# Patient Record
Sex: Female | Born: 1950 | Race: White | Hispanic: No | Marital: Married | Smoking: Never smoker
Health system: Southern US, Community
[De-identification: ages and names within clinical notes are randomized; demographics above are authoritative.]

## PROBLEM LIST (undated history)

## (undated) DIAGNOSIS — A6000 Herpesviral infection of urogenital system, unspecified: Secondary | ICD-10-CM

## (undated) DIAGNOSIS — L409 Psoriasis, unspecified: Secondary | ICD-10-CM

## (undated) DIAGNOSIS — M899 Disorder of bone, unspecified: Secondary | ICD-10-CM

## (undated) DIAGNOSIS — G4700 Insomnia, unspecified: Secondary | ICD-10-CM

## (undated) DIAGNOSIS — E7800 Pure hypercholesterolemia, unspecified: Secondary | ICD-10-CM

## (undated) DIAGNOSIS — N952 Postmenopausal atrophic vaginitis: Secondary | ICD-10-CM

## (undated) DIAGNOSIS — M949 Disorder of cartilage, unspecified: Secondary | ICD-10-CM

## (undated) DIAGNOSIS — I341 Nonrheumatic mitral (valve) prolapse: Secondary | ICD-10-CM

## (undated) HISTORY — DX: Herpesviral infection of urogenital system, unspecified: A60.00

## (undated) HISTORY — DX: Disorder of cartilage, unspecified: M94.9

## (undated) HISTORY — DX: Postmenopausal atrophic vaginitis: N95.2

## (undated) HISTORY — DX: Insomnia, unspecified: G47.00

## (undated) HISTORY — DX: Psoriasis, unspecified: L40.9

## (undated) HISTORY — DX: Pure hypercholesterolemia, unspecified: E78.00

## (undated) HISTORY — DX: Nonrheumatic mitral (valve) prolapse: I34.1

## (undated) HISTORY — DX: Disorder of bone, unspecified: M89.9

---
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
## 1986-05-18 HISTORY — PX: SAPHENOUS VEIN GRAFT RESECTION: SHX2374

## 1997-12-27 ENCOUNTER — Other Ambulatory Visit: Admission: RE | Admit: 1997-12-27 | Discharge: 1997-12-27 | Payer: Self-pay | Admitting: Family Medicine

## 1998-01-03 ENCOUNTER — Ambulatory Visit (HOSPITAL_COMMUNITY): Admission: RE | Admit: 1998-01-03 | Discharge: 1998-01-03 | Payer: Self-pay | Admitting: Family Medicine

## 1999-02-07 ENCOUNTER — Ambulatory Visit (HOSPITAL_COMMUNITY): Admission: RE | Admit: 1999-02-07 | Discharge: 1999-02-07 | Payer: Self-pay | Admitting: Family Medicine

## 1999-02-07 ENCOUNTER — Encounter: Payer: Self-pay | Admitting: Family Medicine

## 1999-03-07 ENCOUNTER — Other Ambulatory Visit: Admission: RE | Admit: 1999-03-07 | Discharge: 1999-03-07 | Payer: Self-pay | Admitting: Podiatry

## 2000-05-18 HISTORY — PX: EXCISION NEUROMA: SHX6350

## 2002-09-04 ENCOUNTER — Ambulatory Visit (HOSPITAL_COMMUNITY): Admission: RE | Admit: 2002-09-04 | Discharge: 2002-09-04 | Payer: Self-pay | Admitting: Family Medicine

## 2002-09-04 ENCOUNTER — Encounter: Payer: Self-pay | Admitting: Family Medicine

## 2004-12-29 ENCOUNTER — Other Ambulatory Visit: Admission: RE | Admit: 2004-12-29 | Discharge: 2004-12-29 | Payer: Self-pay | Admitting: Family Medicine

## 2005-01-23 ENCOUNTER — Encounter: Admission: RE | Admit: 2005-01-23 | Discharge: 2005-01-23 | Payer: Self-pay | Admitting: Family Medicine

## 2006-02-18 ENCOUNTER — Encounter: Admission: RE | Admit: 2006-02-18 | Discharge: 2006-02-18 | Payer: Self-pay | Admitting: Family Medicine

## 2006-12-28 ENCOUNTER — Other Ambulatory Visit: Admission: RE | Admit: 2006-12-28 | Discharge: 2006-12-28 | Payer: Self-pay | Admitting: Family Medicine

## 2007-05-25 ENCOUNTER — Encounter: Admission: RE | Admit: 2007-05-25 | Discharge: 2007-05-25 | Payer: Self-pay | Admitting: Family Medicine

## 2008-01-06 ENCOUNTER — Other Ambulatory Visit: Admission: RE | Admit: 2008-01-06 | Discharge: 2008-01-06 | Payer: Self-pay | Admitting: Family Medicine

## 2008-02-10 ENCOUNTER — Encounter: Admission: RE | Admit: 2008-02-10 | Discharge: 2008-02-10 | Payer: Self-pay | Admitting: Family Medicine

## 2009-03-07 ENCOUNTER — Other Ambulatory Visit: Admission: RE | Admit: 2009-03-07 | Discharge: 2009-03-07 | Payer: Self-pay | Admitting: Family Medicine

## 2009-03-15 ENCOUNTER — Ambulatory Visit (HOSPITAL_COMMUNITY): Admission: RE | Admit: 2009-03-15 | Discharge: 2009-03-15 | Payer: Self-pay | Admitting: Family Medicine

## 2010-03-10 ENCOUNTER — Other Ambulatory Visit: Admission: RE | Admit: 2010-03-10 | Discharge: 2010-03-10 | Payer: Self-pay | Admitting: Family Medicine

## 2010-04-01 ENCOUNTER — Encounter: Admission: RE | Admit: 2010-04-01 | Discharge: 2010-04-01 | Payer: Self-pay | Admitting: Family Medicine

## 2010-06-07 ENCOUNTER — Encounter: Payer: Self-pay | Admitting: Family Medicine

## 2010-06-09 ENCOUNTER — Encounter: Payer: Self-pay | Admitting: Family Medicine

## 2013-07-18 ENCOUNTER — Encounter: Payer: Self-pay | Admitting: *Deleted

## 2013-07-19 ENCOUNTER — Telehealth: Payer: Self-pay

## 2013-07-19 ENCOUNTER — Encounter (INDEPENDENT_AMBULATORY_CARE_PROVIDER_SITE_OTHER): Payer: Self-pay

## 2013-07-19 ENCOUNTER — Ambulatory Visit (INDEPENDENT_AMBULATORY_CARE_PROVIDER_SITE_OTHER): Payer: BC Managed Care – PPO | Admitting: Cardiology

## 2013-07-19 ENCOUNTER — Encounter: Payer: Self-pay | Admitting: Cardiology

## 2013-07-19 VITALS — BP 128/70 | HR 63 | Ht 67.0 in | Wt 145.0 lb

## 2013-07-19 DIAGNOSIS — R55 Syncope and collapse: Secondary | ICD-10-CM

## 2013-07-19 DIAGNOSIS — R0609 Other forms of dyspnea: Secondary | ICD-10-CM

## 2013-07-19 DIAGNOSIS — I341 Nonrheumatic mitral (valve) prolapse: Secondary | ICD-10-CM

## 2013-07-19 DIAGNOSIS — I059 Rheumatic mitral valve disease, unspecified: Secondary | ICD-10-CM

## 2013-07-19 DIAGNOSIS — R0989 Other specified symptoms and signs involving the circulatory and respiratory systems: Secondary | ICD-10-CM

## 2013-07-19 NOTE — Patient Instructions (Signed)
**Note De-Identified Kaila Devries Obfuscation** Your physician has requested that you have an exercise tolerance test. For further information please visit [email or web address]. Please also follow instruction sheet, as given.  Your physician has requested that you have an echocardiogram. Echocardiography is a painless test that uses sound waves to create images of your heart. It provides your doctor with information about the size and shape of your heart and how well your heart's chambers and valves are working. This procedure takes approximately one hour. There are no restrictions for this procedure.  Your physician recommends that you schedule a follow-up appointment in: after tests

## 2013-07-19 NOTE — Progress Notes (Signed)
Patient ID: Jaclyn [name], female   DOB: 27-Feb-1951, 63 y.o.   MRN: [id number]    Patient Name: Jaclyn [name] Date of Encounter: 07/19/2013  Primary Care Provider:  Marjorie Smolder, MD Primary Cardiologist:  Dorothy Spark  Problem List   Past Medical History  Diagnosis Date  . Recurrent genital herpes   . MVP (mitral valve prolapse)     SBE prophylaxis  . Psoriasis   . Disorder of bone and cartilage, unspecified   . Insomnia, unspecified   . Pure hypercholesterolemia   . Postmenopausal atrophic vaginitis    Past Surgical History  Procedure Laterality Date  . Saphenous vein graft resection Left 1988  . Excision neuroma Right 2002    foot  . Tonsillectomy  1959    Allergies  No Known Allergies  HPI  A very pleasant 63 year old female with prior medical history of mitral valve prolapse and a very significant family history of coronary artery disease who is coming for concern of episode of weakness that happened last week. The patient is generally in very good health and walks almost daily for an hour with her dog. The patient had a power bar prior to walking and after 30 minutes of walking she developed acute weakness, fatigue. She denies any chest pain but mild shortness of breath during the episode. She continued to walk at t a slow pace and at the end of the night her symptoms resolved. She never had an episode like that before. Besides walking she also exercises on treadmill. She sometimes experiences exertional shortness of breath but its infrequent. She denies any chest pain. She describes episodes of palpitations especially when she is emotionally stressed but were never associated with syncopal episode. She has history of hypercholesterolemia currently not treated. Her labs were tested by her primary care physician recently and she was told they were all within normal limit. She is only taking vitamin D supplements. Her family history significant for heart attack in her mom  at age of 7, that eventually died of congestive heart failure. Her father died of congestive heart failure. Her brother had 2 stents placed at age of 72. Her younger sister had a stroke in her 27s.  Home Medications  Prior to Admission medications   Medication Sig Start Date End Date Taking? Authorizing Provider  cholecalciferol (VITAMIN D) 400 UNITS TABS tablet Take 400 Units by mouth.   Yes Historical Provider, MD    Family History  Family History  Problem Relation Age of Onset  . Congestive Heart Failure Father   . Other Father     ruptured mitral valve  . Coronary artery disease Mother   . Hypertension Mother   . Osteoporosis Mother   . Heart attack Mother 42  . Hypertension Brother   . Hypercholesterolemia Brother   . CAD Brother     2 stents  . Stroke Sister 13  . Hypertension Sister     Social History  History   Social History  . Marital Status: Married    Spouse Name: N/A    Number of Children: 2  . Years of Education: N/A   Occupational History  . Not on file.   Social History Main Topics  . Smoking status: Never Smoker   . Smokeless tobacco: Never Used  . Alcohol Use: Yes     Comment: occasionally wine  . Drug Use: No  . Sexual Activity: Yes    Birth Control/ Protection: Other-see comments     Comment: menopause  Other Topics Concern  . Not on file   Social History Narrative  . No narrative on file     Review of Systems, as per HPI, otherwise negative General:  No chills, fever, night sweats or weight changes.  Cardiovascular:  No chest pain, dyspnea on exertion, edema, orthopnea, palpitations, paroxysmal nocturnal dyspnea. Dermatological: No rash, lesions/masses Respiratory: No cough, dyspnea Urologic: No hematuria, dysuria Abdominal:   No nausea, vomiting, diarrhea, bright red blood per rectum, melena, or hematemesis Neurologic:  No visual changes, wkns, changes in mental status. All other systems reviewed and are otherwise negative  except as noted above.  Physical Exam  Blood pressure 128/70, pulse 63, height 5\' 7"  (1.702 m), weight 145 lb (65.772 kg).  General: Pleasant, NAD Psych: Normal affect. Neuro: Alert and oriented X 3. Moves all extremities spontaneously. HEENT: Normal  Neck: Supple without bruits or JVD. Lungs:  Resp regular and unlabored, CTA. Heart: RRR no s3, s4, or murmurs. Abdomen: Soft, non-tender, non-distended, BS + x 4.  Extremities: No clubbing, cyanosis or edema. DP/PT/Radials 2+ and equal bilaterally.  Labs: none  Accessory Clinical Findings  echocardiogram  ECG - NSR, rightward axis, otherwise normal ECG.   Assessment & Plan  63 year old female  1. episode of weakness and shortness of breath - considering her significant family history of coronary artery disease we will order an exercise treadmill stress test to evaluate for ischemia. These might have been related to hypoglycemia after insulin surge after eating a power bar.  2. Hypercholesterolemia - we will request her labs from her primary care physician  3. History of mitral wall prolapse and palpitations - we will order a transthoracic echocardiogram to evaluate for mitral valve abnormalities, left atrial size.  Followup in one month  Dorothy Spark, MD, [facility] 07/19/2013, 11:30 AM

## 2013-07-19 NOTE — Telephone Encounter (Signed)
**Note De-Identified Jaclyn [name] Obfuscation** Pt was seen in [place] today with Dr Meda Coffee. Per Dr Meda Coffee I called Dr Moses Manners office to request a copy of the pts recent lab work (Lipids, CMP and CBC). I was advised that that office is having computer and telephone issues today that is being worked on and that as soon as their system is working they will fax over those lab results to ATTN: Jeani Hawking. Also, I gave our office phone number and fax number so info can be sent.

## 2013-07-28 ENCOUNTER — Telehealth (HOSPITAL_COMMUNITY): Payer: Self-pay

## 2013-08-02 ENCOUNTER — Encounter (HOSPITAL_COMMUNITY): Payer: BC Managed Care – PPO

## 2013-08-02 ENCOUNTER — Inpatient Hospital Stay (HOSPITAL_COMMUNITY)
Admission: RE | Admit: 2013-08-02 | Discharge: 2013-08-02 | Disposition: A | Discharge status: Home / Self Care | Payer: BC Managed Care – PPO | Source: Ambulatory Visit | Attending: Cardiology | Admitting: Cardiology

## 2013-08-02 DIAGNOSIS — I341 Nonrheumatic mitral (valve) prolapse: Secondary | ICD-10-CM

## 2013-08-11 ENCOUNTER — Encounter: Payer: Self-pay | Admitting: Cardiology

## 2013-08-11 ENCOUNTER — Telehealth (HOSPITAL_COMMUNITY): Payer: Self-pay

## 2013-08-16 ENCOUNTER — Ambulatory Visit (HOSPITAL_COMMUNITY): Payer: BC Managed Care – PPO

## 2013-08-16 ENCOUNTER — Encounter (HOSPITAL_COMMUNITY): Payer: BC Managed Care – PPO

## 2013-08-21 ENCOUNTER — Ambulatory Visit: Payer: BC Managed Care – PPO | Admitting: Cardiology

## 2013-11-23 NOTE — Telephone Encounter (Signed)
Encounter complete. 

## 2014-07-18 ENCOUNTER — Other Ambulatory Visit: Payer: Self-pay | Admitting: Family Medicine

## 2014-07-18 ENCOUNTER — Other Ambulatory Visit (HOSPITAL_COMMUNITY)
Admission: RE | Admit: 2014-07-18 | Discharge: 2014-07-18 | Disposition: A | Discharge status: Home / Self Care | Payer: BC Managed Care – PPO | Source: Ambulatory Visit | Attending: Family Medicine | Admitting: Family Medicine

## 2014-07-18 DIAGNOSIS — R87810 Cervical high risk human papillomavirus (HPV) DNA test positive: Secondary | ICD-10-CM | POA: Diagnosis present

## 2014-07-18 DIAGNOSIS — Z124 Encounter for screening for malignant neoplasm of cervix: Secondary | ICD-10-CM | POA: Diagnosis not present

## 2014-07-18 DIAGNOSIS — Z1151 Encounter for screening for human papillomavirus (HPV): Secondary | ICD-10-CM | POA: Diagnosis present

## 2014-07-19 LAB — CYTOLOGY - PAP

## 2015-01-10 DIAGNOSIS — H43812 Vitreous degeneration, left eye: Secondary | ICD-10-CM | POA: Insufficient documentation

## 2015-01-30 ENCOUNTER — Other Ambulatory Visit: Payer: Self-pay | Admitting: Family Medicine

## 2015-01-30 ENCOUNTER — Other Ambulatory Visit (HOSPITAL_COMMUNITY)
Admission: RE | Admit: 2015-01-30 | Discharge: 2015-01-30 | Disposition: A | Discharge status: Home / Self Care | Payer: BC Managed Care – PPO | Source: Ambulatory Visit | Attending: Family Medicine | Admitting: Family Medicine

## 2015-01-30 DIAGNOSIS — Z1151 Encounter for screening for human papillomavirus (HPV): Secondary | ICD-10-CM | POA: Insufficient documentation

## 2015-01-30 DIAGNOSIS — Z01411 Encounter for gynecological examination (general) (routine) with abnormal findings: Secondary | ICD-10-CM | POA: Insufficient documentation

## 2015-02-04 LAB — CYTOLOGY - PAP

## 2015-02-15 ENCOUNTER — Other Ambulatory Visit: Payer: Self-pay | Admitting: Obstetrics & Gynecology

## 2015-03-25 ENCOUNTER — Ambulatory Visit (HOSPITAL_COMMUNITY)
Admission: RE | Admit: 2015-03-25 | Payer: BC Managed Care – PPO | Source: Ambulatory Visit | Admitting: Obstetrics & Gynecology

## 2015-03-25 ENCOUNTER — Encounter (HOSPITAL_COMMUNITY): Admission: RE | Payer: Self-pay | Source: Ambulatory Visit

## 2015-03-25 SURGERY — CONE BIOPSY, CERVIX
Anesthesia: Choice

## 2015-03-27 ENCOUNTER — Other Ambulatory Visit: Payer: Self-pay | Admitting: Obstetrics and Gynecology

## 2015-04-19 ENCOUNTER — Other Ambulatory Visit: Payer: Self-pay | Admitting: Obstetrics and Gynecology

## 2015-08-22 DIAGNOSIS — Z0000 Encounter for general adult medical examination without abnormal findings: Secondary | ICD-10-CM | POA: Diagnosis not present

## 2015-08-22 DIAGNOSIS — E7800 Pure hypercholesterolemia, unspecified: Secondary | ICD-10-CM | POA: Diagnosis not present

## 2015-08-22 DIAGNOSIS — D069 Carcinoma in situ of cervix, unspecified: Secondary | ICD-10-CM | POA: Diagnosis not present

## 2015-08-22 DIAGNOSIS — Z23 Encounter for immunization: Secondary | ICD-10-CM | POA: Diagnosis not present

## 2015-08-22 DIAGNOSIS — Z131 Encounter for screening for diabetes mellitus: Secondary | ICD-10-CM | POA: Diagnosis not present

## 2015-08-26 ENCOUNTER — Other Ambulatory Visit: Payer: Self-pay | Admitting: Family Medicine

## 2015-08-26 DIAGNOSIS — M8580 Other specified disorders of bone density and structure, unspecified site: Secondary | ICD-10-CM

## 2015-09-11 ENCOUNTER — Other Ambulatory Visit: Payer: Self-pay | Admitting: Family Medicine

## 2015-09-11 DIAGNOSIS — E2839 Other primary ovarian failure: Secondary | ICD-10-CM

## 2015-09-12 ENCOUNTER — Ambulatory Visit
Admission: RE | Admit: 2015-09-12 | Discharge: 2015-09-12 | Disposition: A | Discharge status: Home / Self Care | Payer: PPO | Source: Ambulatory Visit | Attending: Family Medicine | Admitting: Family Medicine

## 2015-09-12 DIAGNOSIS — M85852 Other specified disorders of bone density and structure, left thigh: Secondary | ICD-10-CM | POA: Diagnosis not present

## 2015-09-12 DIAGNOSIS — E2839 Other primary ovarian failure: Secondary | ICD-10-CM

## 2015-09-12 DIAGNOSIS — Z780 Asymptomatic menopausal state: Secondary | ICD-10-CM | POA: Diagnosis not present

## 2015-09-18 DIAGNOSIS — N3000 Acute cystitis without hematuria: Secondary | ICD-10-CM | POA: Diagnosis not present

## 2015-09-20 DIAGNOSIS — Z1231 Encounter for screening mammogram for malignant neoplasm of breast: Secondary | ICD-10-CM | POA: Diagnosis not present

## 2015-11-14 ENCOUNTER — Other Ambulatory Visit: Payer: Self-pay | Admitting: Obstetrics and Gynecology

## 2015-11-14 DIAGNOSIS — N871 Moderate cervical dysplasia: Secondary | ICD-10-CM | POA: Diagnosis not present

## 2015-11-18 LAB — CYTOLOGY - PAP

## 2015-12-18 DIAGNOSIS — H2513 Age-related nuclear cataract, bilateral: Secondary | ICD-10-CM | POA: Diagnosis not present

## 2016-08-20 DIAGNOSIS — R030 Elevated blood-pressure reading, without diagnosis of hypertension: Secondary | ICD-10-CM | POA: Diagnosis not present

## 2016-08-20 DIAGNOSIS — J069 Acute upper respiratory infection, unspecified: Secondary | ICD-10-CM | POA: Diagnosis not present

## 2016-09-03 DIAGNOSIS — M8580 Other specified disorders of bone density and structure, unspecified site: Secondary | ICD-10-CM | POA: Diagnosis not present

## 2016-09-03 DIAGNOSIS — E559 Vitamin D deficiency, unspecified: Secondary | ICD-10-CM | POA: Diagnosis not present

## 2016-09-03 DIAGNOSIS — E7800 Pure hypercholesterolemia, unspecified: Secondary | ICD-10-CM | POA: Diagnosis not present

## 2016-09-03 DIAGNOSIS — Z23 Encounter for immunization: Secondary | ICD-10-CM | POA: Diagnosis not present

## 2016-09-03 DIAGNOSIS — Z0000 Encounter for general adult medical examination without abnormal findings: Secondary | ICD-10-CM | POA: Diagnosis not present

## 2016-09-03 DIAGNOSIS — Z131 Encounter for screening for diabetes mellitus: Secondary | ICD-10-CM | POA: Diagnosis not present

## 2016-09-30 DIAGNOSIS — Z1211 Encounter for screening for malignant neoplasm of colon: Secondary | ICD-10-CM | POA: Diagnosis not present

## 2016-09-30 DIAGNOSIS — Z1212 Encounter for screening for malignant neoplasm of rectum: Secondary | ICD-10-CM | POA: Diagnosis not present

## 2016-10-16 DIAGNOSIS — R195 Other fecal abnormalities: Secondary | ICD-10-CM | POA: Diagnosis not present

## 2016-11-19 DIAGNOSIS — Z6822 Body mass index (BMI) 22.0-22.9, adult: Secondary | ICD-10-CM | POA: Diagnosis not present

## 2016-11-19 DIAGNOSIS — Z01419 Encounter for gynecological examination (general) (routine) without abnormal findings: Secondary | ICD-10-CM | POA: Diagnosis not present

## 2016-11-19 DIAGNOSIS — Z124 Encounter for screening for malignant neoplasm of cervix: Secondary | ICD-10-CM | POA: Diagnosis not present

## 2016-11-19 DIAGNOSIS — Z1231 Encounter for screening mammogram for malignant neoplasm of breast: Secondary | ICD-10-CM | POA: Diagnosis not present

## 2016-11-30 ENCOUNTER — Other Ambulatory Visit: Payer: Self-pay | Admitting: Gastroenterology

## 2016-11-30 DIAGNOSIS — R195 Other fecal abnormalities: Secondary | ICD-10-CM

## 2016-12-11 ENCOUNTER — Ambulatory Visit
Admission: RE | Admit: 2016-12-11 | Discharge: 2016-12-11 | Disposition: A | Discharge status: Home / Self Care | Payer: PPO | Source: Ambulatory Visit | Attending: Gastroenterology | Admitting: Gastroenterology

## 2016-12-11 DIAGNOSIS — Z1211 Encounter for screening for malignant neoplasm of colon: Secondary | ICD-10-CM | POA: Diagnosis not present

## 2016-12-11 DIAGNOSIS — R195 Other fecal abnormalities: Secondary | ICD-10-CM

## 2016-12-23 DIAGNOSIS — H2513 Age-related nuclear cataract, bilateral: Secondary | ICD-10-CM | POA: Diagnosis not present

## 2016-12-23 DIAGNOSIS — H524 Presbyopia: Secondary | ICD-10-CM | POA: Diagnosis not present

## 2016-12-23 DIAGNOSIS — H5203 Hypermetropia, bilateral: Secondary | ICD-10-CM | POA: Diagnosis not present

## 2016-12-23 DIAGNOSIS — H52203 Unspecified astigmatism, bilateral: Secondary | ICD-10-CM | POA: Diagnosis not present

## 2017-06-03 DIAGNOSIS — D229 Melanocytic nevi, unspecified: Secondary | ICD-10-CM | POA: Diagnosis not present

## 2017-06-03 DIAGNOSIS — L409 Psoriasis, unspecified: Secondary | ICD-10-CM | POA: Diagnosis not present

## 2017-06-03 DIAGNOSIS — I872 Venous insufficiency (chronic) (peripheral): Secondary | ICD-10-CM | POA: Diagnosis not present

## 2017-06-10 DIAGNOSIS — R05 Cough: Secondary | ICD-10-CM | POA: Diagnosis not present

## 2017-06-17 DIAGNOSIS — I83893 Varicose veins of bilateral lower extremities with other complications: Secondary | ICD-10-CM | POA: Diagnosis not present

## 2017-06-17 DIAGNOSIS — I8311 Varicose veins of right lower extremity with inflammation: Secondary | ICD-10-CM | POA: Diagnosis not present

## 2017-06-17 DIAGNOSIS — I8312 Varicose veins of left lower extremity with inflammation: Secondary | ICD-10-CM | POA: Diagnosis not present

## 2017-06-17 DIAGNOSIS — I83813 Varicose veins of bilateral lower extremities with pain: Secondary | ICD-10-CM | POA: Diagnosis not present

## 2017-06-20 DIAGNOSIS — J069 Acute upper respiratory infection, unspecified: Secondary | ICD-10-CM | POA: Diagnosis not present

## 2017-06-20 DIAGNOSIS — J029 Acute pharyngitis, unspecified: Secondary | ICD-10-CM | POA: Diagnosis not present

## 2017-06-20 DIAGNOSIS — R05 Cough: Secondary | ICD-10-CM | POA: Diagnosis not present

## 2017-07-07 DIAGNOSIS — I8312 Varicose veins of left lower extremity with inflammation: Secondary | ICD-10-CM | POA: Diagnosis not present

## 2017-07-07 DIAGNOSIS — I83813 Varicose veins of bilateral lower extremities with pain: Secondary | ICD-10-CM | POA: Diagnosis not present

## 2017-07-07 DIAGNOSIS — I83893 Varicose veins of bilateral lower extremities with other complications: Secondary | ICD-10-CM | POA: Diagnosis not present

## 2017-07-07 DIAGNOSIS — I8311 Varicose veins of right lower extremity with inflammation: Secondary | ICD-10-CM | POA: Diagnosis not present

## 2017-08-02 DIAGNOSIS — I83813 Varicose veins of bilateral lower extremities with pain: Secondary | ICD-10-CM | POA: Diagnosis not present

## 2017-08-02 DIAGNOSIS — I83893 Varicose veins of bilateral lower extremities with other complications: Secondary | ICD-10-CM | POA: Diagnosis not present

## 2017-10-14 DIAGNOSIS — E559 Vitamin D deficiency, unspecified: Secondary | ICD-10-CM | POA: Diagnosis not present

## 2017-10-14 DIAGNOSIS — E7800 Pure hypercholesterolemia, unspecified: Secondary | ICD-10-CM | POA: Diagnosis not present

## 2017-10-14 DIAGNOSIS — Z0000 Encounter for general adult medical examination without abnormal findings: Secondary | ICD-10-CM | POA: Diagnosis not present

## 2017-10-14 DIAGNOSIS — M8580 Other specified disorders of bone density and structure, unspecified site: Secondary | ICD-10-CM | POA: Diagnosis not present

## 2017-10-14 DIAGNOSIS — Z131 Encounter for screening for diabetes mellitus: Secondary | ICD-10-CM | POA: Diagnosis not present

## 2017-10-19 ENCOUNTER — Other Ambulatory Visit: Payer: Self-pay | Admitting: Family Medicine

## 2017-10-19 DIAGNOSIS — M8580 Other specified disorders of bone density and structure, unspecified site: Secondary | ICD-10-CM

## 2017-12-06 DIAGNOSIS — Z1231 Encounter for screening mammogram for malignant neoplasm of breast: Secondary | ICD-10-CM | POA: Diagnosis not present

## 2017-12-09 ENCOUNTER — Ambulatory Visit
Admission: RE | Admit: 2017-12-09 | Discharge: 2017-12-09 | Disposition: A | Discharge status: Home / Self Care | Payer: PPO | Source: Ambulatory Visit | Attending: Family Medicine | Admitting: Family Medicine

## 2017-12-09 ENCOUNTER — Encounter: Payer: Self-pay | Admitting: Radiology

## 2017-12-09 DIAGNOSIS — Z780 Asymptomatic menopausal state: Secondary | ICD-10-CM | POA: Diagnosis not present

## 2017-12-09 DIAGNOSIS — M85852 Other specified disorders of bone density and structure, left thigh: Secondary | ICD-10-CM | POA: Diagnosis not present

## 2017-12-09 DIAGNOSIS — M8580 Other specified disorders of bone density and structure, unspecified site: Secondary | ICD-10-CM

## 2017-12-29 DIAGNOSIS — H5203 Hypermetropia, bilateral: Secondary | ICD-10-CM | POA: Diagnosis not present

## 2017-12-29 DIAGNOSIS — H524 Presbyopia: Secondary | ICD-10-CM | POA: Diagnosis not present

## 2017-12-29 DIAGNOSIS — H52203 Unspecified astigmatism, bilateral: Secondary | ICD-10-CM | POA: Diagnosis not present

## 2017-12-29 DIAGNOSIS — H2513 Age-related nuclear cataract, bilateral: Secondary | ICD-10-CM | POA: Diagnosis not present

## 2018-02-21 DIAGNOSIS — M25511 Pain in right shoulder: Secondary | ICD-10-CM | POA: Diagnosis not present

## 2018-03-03 DIAGNOSIS — M9902 Segmental and somatic dysfunction of thoracic region: Secondary | ICD-10-CM | POA: Diagnosis not present

## 2018-03-03 DIAGNOSIS — M9903 Segmental and somatic dysfunction of lumbar region: Secondary | ICD-10-CM | POA: Diagnosis not present

## 2018-03-03 DIAGNOSIS — M9905 Segmental and somatic dysfunction of pelvic region: Secondary | ICD-10-CM | POA: Diagnosis not present

## 2018-03-03 DIAGNOSIS — M9904 Segmental and somatic dysfunction of sacral region: Secondary | ICD-10-CM | POA: Diagnosis not present

## 2018-03-04 DIAGNOSIS — Z23 Encounter for immunization: Secondary | ICD-10-CM | POA: Diagnosis not present

## 2018-03-10 DIAGNOSIS — M9904 Segmental and somatic dysfunction of sacral region: Secondary | ICD-10-CM | POA: Diagnosis not present

## 2018-03-10 DIAGNOSIS — M9902 Segmental and somatic dysfunction of thoracic region: Secondary | ICD-10-CM | POA: Diagnosis not present

## 2018-03-10 DIAGNOSIS — M9905 Segmental and somatic dysfunction of pelvic region: Secondary | ICD-10-CM | POA: Diagnosis not present

## 2018-03-10 DIAGNOSIS — M9903 Segmental and somatic dysfunction of lumbar region: Secondary | ICD-10-CM | POA: Diagnosis not present

## 2018-03-11 ENCOUNTER — Other Ambulatory Visit: Payer: Self-pay | Admitting: Physician Assistant

## 2018-03-11 DIAGNOSIS — D229 Melanocytic nevi, unspecified: Secondary | ICD-10-CM | POA: Diagnosis not present

## 2018-03-11 DIAGNOSIS — L720 Epidermal cyst: Secondary | ICD-10-CM | POA: Diagnosis not present

## 2018-03-17 DIAGNOSIS — M9903 Segmental and somatic dysfunction of lumbar region: Secondary | ICD-10-CM | POA: Diagnosis not present

## 2018-03-17 DIAGNOSIS — M9905 Segmental and somatic dysfunction of pelvic region: Secondary | ICD-10-CM | POA: Diagnosis not present

## 2018-03-17 DIAGNOSIS — M9902 Segmental and somatic dysfunction of thoracic region: Secondary | ICD-10-CM | POA: Diagnosis not present

## 2018-03-17 DIAGNOSIS — M9904 Segmental and somatic dysfunction of sacral region: Secondary | ICD-10-CM | POA: Diagnosis not present

## 2018-03-31 DIAGNOSIS — M9904 Segmental and somatic dysfunction of sacral region: Secondary | ICD-10-CM | POA: Diagnosis not present

## 2018-03-31 DIAGNOSIS — M9903 Segmental and somatic dysfunction of lumbar region: Secondary | ICD-10-CM | POA: Diagnosis not present

## 2018-03-31 DIAGNOSIS — M9902 Segmental and somatic dysfunction of thoracic region: Secondary | ICD-10-CM | POA: Diagnosis not present

## 2018-03-31 DIAGNOSIS — M9905 Segmental and somatic dysfunction of pelvic region: Secondary | ICD-10-CM | POA: Diagnosis not present

## 2018-05-05 DIAGNOSIS — M25511 Pain in right shoulder: Secondary | ICD-10-CM | POA: Diagnosis not present

## 2018-05-20 DIAGNOSIS — M25511 Pain in right shoulder: Secondary | ICD-10-CM | POA: Diagnosis not present

## 2018-05-27 DIAGNOSIS — M25511 Pain in right shoulder: Secondary | ICD-10-CM | POA: Diagnosis not present

## 2018-06-03 DIAGNOSIS — M25511 Pain in right shoulder: Secondary | ICD-10-CM | POA: Diagnosis not present

## 2018-06-10 DIAGNOSIS — M25511 Pain in right shoulder: Secondary | ICD-10-CM | POA: Diagnosis not present

## 2018-06-17 DIAGNOSIS — M25511 Pain in right shoulder: Secondary | ICD-10-CM | POA: Diagnosis not present

## 2018-06-24 DIAGNOSIS — M25511 Pain in right shoulder: Secondary | ICD-10-CM | POA: Diagnosis not present

## 2018-07-01 DIAGNOSIS — M25511 Pain in right shoulder: Secondary | ICD-10-CM | POA: Diagnosis not present

## 2018-07-15 DIAGNOSIS — M25511 Pain in right shoulder: Secondary | ICD-10-CM | POA: Diagnosis not present

## 2018-07-28 DIAGNOSIS — M25511 Pain in right shoulder: Secondary | ICD-10-CM | POA: Diagnosis not present

## 2018-09-25 ENCOUNTER — Emergency Department (HOSPITAL_COMMUNITY)
Admission: EM | Admit: 2018-09-25 | Discharge: 2018-09-25 | Disposition: A | Discharge status: Home / Self Care | Payer: PPO | Attending: Emergency Medicine | Admitting: Emergency Medicine

## 2018-09-25 ENCOUNTER — Encounter (HOSPITAL_COMMUNITY): Payer: Self-pay

## 2018-09-25 ENCOUNTER — Other Ambulatory Visit: Payer: Self-pay

## 2018-09-25 DIAGNOSIS — R55 Syncope and collapse: Secondary | ICD-10-CM | POA: Insufficient documentation

## 2018-09-25 DIAGNOSIS — Z79899 Other long term (current) drug therapy: Secondary | ICD-10-CM | POA: Diagnosis not present

## 2018-09-25 DIAGNOSIS — R42 Dizziness and giddiness: Secondary | ICD-10-CM | POA: Diagnosis present

## 2018-09-25 LAB — CBC
HCT: 42.9 % (ref 36.0–46.0)
Hemoglobin: 13.6 g/dL (ref 12.0–15.0)
MCH: 30.6 pg (ref 26.0–34.0)
MCHC: 31.7 g/dL (ref 30.0–36.0)
MCV: 96.4 fL (ref 80.0–100.0)
Platelets: 253 10*3/uL (ref 150–400)
RBC: 4.45 MIL/uL (ref 3.87–5.11)
RDW: 12.9 % (ref 11.5–15.5)
WBC: 7.1 10*3/uL (ref 4.0–10.5)
nRBC: 0 % (ref 0.0–0.2)

## 2018-09-25 LAB — BASIC METABOLIC PANEL
Anion gap: 8 (ref 5–15)
BUN: 12 mg/dL (ref 8–23)
CO2: 26 mmol/L (ref 22–32)
Calcium: 8.6 mg/dL — ABNORMAL LOW (ref 8.9–10.3)
Chloride: 107 mmol/L (ref 98–111)
Creatinine, Ser: 0.58 mg/dL (ref 0.44–1.00)
GFR calc Af Amer: >60 mL/min (ref >60)
GFR calc non Af Amer: >60 mL/min (ref >60)
Glucose, Bld: 107 mg/dL — ABNORMAL HIGH (ref 70–99)
Potassium: 3.4 mmol/L — ABNORMAL LOW (ref 3.5–5.1)
Sodium: 141 mmol/L (ref 135–145)

## 2018-09-25 NOTE — ED Notes (Signed)
Patient has changed her position on being discharged and would like to get blood work however does not want the EKG.

## 2018-09-25 NOTE — ED Notes (Signed)
Pt wants to hold off on Dr order for EKG would like consult with her dr on EKG.

## 2018-09-25 NOTE — ED Notes (Signed)
Patient is ready to be discharged. Patient does not want any medical work done.

## 2018-09-25 NOTE — ED Triage Notes (Signed)
Pt states since yesterday she has had digestive issues, elevated heart rate, dizziness, and rash. Pt states that she thinks it may be poison sumac. Pt states that she has had loose bowels.

## 2018-09-25 NOTE — ED Provider Notes (Signed)
[facility] DEPT Provider Note   CSN: [id number] Arrival date & time: 09/25/18  1258    History   Chief Complaint Chief Complaint  Patient presents with  . Dizziness  . Rash    HPI Jaclyn [name] is a 68 y.o. female.     HPI Pt states she had an episode of feeling very lightheaded and dizzy yesterday morning.  She fell to the ground but did not lose consciousness.  He felt that her heart was racing.  Patient was eventually able to get up on her own.No room spinning today, yesterday morning she did feel that was occurring.  Patient states she is not having as much trouble as she did yesterday but she does feel somewhat lightheaded.  No trouble with speech or vision.  No trouble with gait. Some loose bowels, no diarrhea.  Patient is also developed a rash over the last few days.  She has been out gardening.  She thinks it could be related to poison sumac.  Patient states she has a history of mitral valve prolapse but no history of heart disease.  No history of stroke or TIA. Past Medical History:  Diagnosis Date  . Disorder of bone and cartilage, unspecified   . Insomnia, unspecified   . MVP (mitral valve prolapse)    SBE prophylaxis  . Postmenopausal atrophic vaginitis   . Psoriasis   . Pure hypercholesterolemia   . Recurrent genital herpes     Patient Active Problem List   Diagnosis Date Noted  . Pre-syncope 07/19/2013    Past Surgical History:  Procedure Laterality Date  . EXCISION NEUROMA Right 2002   foot  . SAPHENOUS VEIN GRAFT RESECTION Left 1988  . TONSILLECTOMY  1959     OB History   No obstetric history on file.      Home Medications    Prior to Admission medications   Medication Sig Start Date End Date Taking? Authorizing Provider  Magnesium 200 MG TABS Take 200 mg by mouth daily.    [provider]  Probiotic Product (PROBIOTIC PO) Take 1 capsule by mouth daily.    [provider]  TURMERIC PO Take 1  tablet by mouth daily.    [provider]    Family History Family History  Problem Relation Age of Onset  . Coronary artery disease Mother   . Hypertension Mother   . Osteoporosis Mother   . Heart attack Mother 2  . Congestive Heart Failure Father   . Other Father        ruptured mitral valve  . Hypertension Brother   . Hypercholesterolemia Brother   . CAD Brother        2 stents  . Stroke Sister 19  . Hypertension Sister     Social History Social History   Tobacco Use  . Smoking status: Never Smoker  . Smokeless tobacco: Never Used  Substance Use Topics  . Alcohol use: Yes    Comment: occasionally wine  . Drug use: No     Allergies   Patient has no known allergies.   Review of Systems Review of Systems  Constitutional: Negative for fever.       Fatigue recently with activity  Respiratory: Negative for cough and shortness of breath.   Cardiovascular: Negative for chest pain.  All other systems reviewed and are negative.    Physical Exam Updated Vital Signs BP (!) 182/77   Pulse 83   Temp 97.9 F (36.6 C) (  Oral)   Resp 14   Ht 1.702 m (5\' 7" )   Wt 63.5 kg   SpO2 100%   BMI 21.93 kg/m   Physical Exam Vitals signs and nursing note reviewed.  Constitutional:      General: She is not in acute distress.    Appearance: She is well-developed.  HENT:     Head: Normocephalic and atraumatic.     Right Ear: External ear normal.     Left Ear: External ear normal.  Eyes:     General: No scleral icterus.       Right eye: No discharge.        Left eye: No discharge.     Conjunctiva/sclera: Conjunctivae normal.  Neck:     Musculoskeletal: Neck supple.     Trachea: No tracheal deviation.  Cardiovascular:     Rate and Rhythm: Normal rate and regular rhythm.  Pulmonary:     Effort: Pulmonary effort is normal. No respiratory distress.     Breath sounds: Normal breath sounds. No stridor. No wheezing or rales.  Abdominal:     General: Bowel  sounds are normal. There is no distension.     Palpations: Abdomen is soft.     Tenderness: There is no abdominal tenderness. There is no guarding or rebound.  Musculoskeletal:        General: No tenderness.  Skin:    General: Skin is warm and dry.     Findings: No rash.  Neurological:     Mental Status: She is alert and oriented to person, place, and time.     Cranial Nerves: No cranial nerve deficit (No facial droop, extraocular movements intact, tongue midline ).     Sensory: No sensory deficit.     Motor: No abnormal muscle tone or seizure activity.     Coordination: Coordination normal.     Comments: No pronator drift bilateral upper extrem, able to hold both legs off bed for 5 seconds, sensation intact in all extremities, no visual field cuts, no left or right sided neglect, normal finger-nose exam bilaterally, no nystagmus noted       ED Treatments / Results  Labs (all labs ordered are listed, but only abnormal results are displayed) Labs Reviewed - No data to display  EKG EKG Interpretation  Date/Time:  Sunday Sep 25 2018 17:52:31 EDT Ventricular Rate:  66 PR Interval:    QRS Duration: 81 QT Interval:  435 QTC Calculation: 456 R Axis:   76 Text Interpretation:  Sinus rhythm Baseline wander in lead(s) I III aVL Confirmed by Virgel Manifold [phone]) on 09/25/2018 5:56:07 PM Also confirmed by Virgel Manifold [phone]), editor Philomena Doheny [phone])  on 09/26/2018 7:14:31 AM   Radiology No results found.  Procedures Procedures (including critical care time)  Medications Ordered in ED Medications - No data to display   Initial Impression / Assessment and Plan / ED Course  I have reviewed the triage vital signs and the nursing notes.  Pertinent labs & imaging results that were available during my care of the patient were reviewed by me and considered in my medical decision making (see chart for details).  Clinical Course as of Sep 24 1345  Sun Sep 25, 2018  1346  Discussed head CT with patient.  She denies ha or neuro symptoms now.  She does not feel it is necessary and would prefer not to have that done.   [JK]    Clinical Course User Index [JK] Dorie Rank, MD  Pt presented with near syncopal day before.  Normal neuro exam in the ED.  Vitals sgins notable for htn otherwise exam normal.  Not suggestive of tia, stroke with lack of focal neuro deficit.  NO shortness of breath or leg swelling to suggest PE.  Plan on labs, EKG.  Pending at time of dc.  Dr Wilson Singer to follow up on results and dispo  5/11 Labs were normal.  EKG normal.  No clear etiology but pt was asymptomatic, ed workup reassuring.  No signs of emergency medical condition.    Final Clinical Impressions(s) / ED Diagnoses   Final diagnoses:  Near syncope    ED Discharge Orders    None       Dorie Rank, MD 09/26/18 1204

## 2018-09-25 NOTE — ED Notes (Signed)
Patient given water

## 2018-09-26 DIAGNOSIS — Z719 Counseling, unspecified: Secondary | ICD-10-CM | POA: Diagnosis not present

## 2018-09-26 DIAGNOSIS — L237 Allergic contact dermatitis due to plants, except food: Secondary | ICD-10-CM | POA: Diagnosis not present

## 2018-09-26 DIAGNOSIS — R030 Elevated blood-pressure reading, without diagnosis of hypertension: Secondary | ICD-10-CM | POA: Diagnosis not present

## 2018-09-26 DIAGNOSIS — R42 Dizziness and giddiness: Secondary | ICD-10-CM | POA: Diagnosis not present

## 2018-09-26 DIAGNOSIS — L259 Unspecified contact dermatitis, unspecified cause: Secondary | ICD-10-CM | POA: Diagnosis not present

## 2018-09-26 DIAGNOSIS — Z0130 Encounter for examination of blood pressure without abnormal findings: Secondary | ICD-10-CM | POA: Diagnosis not present

## 2018-09-28 DIAGNOSIS — Z20828 Contact with and (suspected) exposure to other viral communicable diseases: Secondary | ICD-10-CM | POA: Diagnosis not present

## 2018-11-24 DIAGNOSIS — E559 Vitamin D deficiency, unspecified: Secondary | ICD-10-CM | POA: Diagnosis not present

## 2018-11-24 DIAGNOSIS — Z0000 Encounter for general adult medical examination without abnormal findings: Secondary | ICD-10-CM | POA: Diagnosis not present

## 2018-11-24 DIAGNOSIS — Z1389 Encounter for screening for other disorder: Secondary | ICD-10-CM | POA: Diagnosis not present

## 2018-11-24 DIAGNOSIS — E7800 Pure hypercholesterolemia, unspecified: Secondary | ICD-10-CM | POA: Diagnosis not present

## 2018-11-24 DIAGNOSIS — R739 Hyperglycemia, unspecified: Secondary | ICD-10-CM | POA: Diagnosis not present

## 2018-11-24 DIAGNOSIS — M8580 Other specified disorders of bone density and structure, unspecified site: Secondary | ICD-10-CM | POA: Diagnosis not present

## 2018-11-24 DIAGNOSIS — Z1159 Encounter for screening for other viral diseases: Secondary | ICD-10-CM | POA: Diagnosis not present

## 2019-01-05 DIAGNOSIS — E559 Vitamin D deficiency, unspecified: Secondary | ICD-10-CM | POA: Diagnosis not present

## 2019-01-05 DIAGNOSIS — Z1159 Encounter for screening for other viral diseases: Secondary | ICD-10-CM | POA: Diagnosis not present

## 2019-01-05 DIAGNOSIS — R739 Hyperglycemia, unspecified: Secondary | ICD-10-CM | POA: Diagnosis not present

## 2019-01-05 DIAGNOSIS — E7800 Pure hypercholesterolemia, unspecified: Secondary | ICD-10-CM | POA: Diagnosis not present

## 2019-01-06 DIAGNOSIS — Z124 Encounter for screening for malignant neoplasm of cervix: Secondary | ICD-10-CM | POA: Diagnosis not present

## 2019-01-06 DIAGNOSIS — R87612 Low grade squamous intraepithelial lesion on cytologic smear of cervix (LGSIL): Secondary | ICD-10-CM | POA: Diagnosis not present

## 2019-01-06 DIAGNOSIS — Z01419 Encounter for gynecological examination (general) (routine) without abnormal findings: Secondary | ICD-10-CM | POA: Diagnosis not present

## 2019-01-06 DIAGNOSIS — Z1231 Encounter for screening mammogram for malignant neoplasm of breast: Secondary | ICD-10-CM | POA: Diagnosis not present

## 2019-01-26 DIAGNOSIS — Z23 Encounter for immunization: Secondary | ICD-10-CM | POA: Diagnosis not present

## 2019-03-02 DIAGNOSIS — B009 Herpesviral infection, unspecified: Secondary | ICD-10-CM | POA: Diagnosis not present

## 2019-03-02 DIAGNOSIS — R87622 Low grade squamous intraepithelial lesion on cytologic smear of vagina (LGSIL): Secondary | ICD-10-CM | POA: Diagnosis not present

## 2019-03-02 DIAGNOSIS — L900 Lichen sclerosus et atrophicus: Secondary | ICD-10-CM | POA: Insufficient documentation

## 2019-03-27 DIAGNOSIS — Z20828 Contact with and (suspected) exposure to other viral communicable diseases: Secondary | ICD-10-CM | POA: Diagnosis not present

## 2019-06-10 ENCOUNTER — Ambulatory Visit: Payer: PPO | Attending: Internal Medicine

## 2019-06-10 DIAGNOSIS — Z23 Encounter for immunization: Secondary | ICD-10-CM | POA: Insufficient documentation

## 2019-06-10 NOTE — Progress Notes (Signed)
   Covid-19 Vaccination Clinic  Name:  Jaclyn [name]    MRN: [id number] DOB: [birth year]  06/10/2019  Jaclyn [name] was observed post Covid-19 immunization for 15 minutes without incidence. She was provided with Vaccine Information Sheet and instruction to access the V-Safe system.   Jaclyn [name] was instructed to call 911 with any severe reactions post vaccine: Marland Kitchen Difficulty breathing  . Swelling of your face and throat  . A fast heartbeat  . A bad rash all over your body  . Dizziness and weakness    Immunizations Administered    Name Date Dose VIS Date Route   Pfizer COVID-19 Vaccine 06/10/2019 12:55 PM 0.3 mL 04/28/2019 Intramuscular   Manufacturer: [place]   Lot: [id number]   [place]: [id number]

## 2019-07-01 ENCOUNTER — Ambulatory Visit: Payer: PPO | Attending: Internal Medicine

## 2019-07-01 DIAGNOSIS — Z23 Encounter for immunization: Secondary | ICD-10-CM

## 2019-07-01 NOTE — Progress Notes (Signed)
   Covid-19 Vaccination Clinic  Name:  Jaclyn [name]    MRN: [id number] DOB: 10-Nov-1950  07/01/2019  [name] was observed post Covid-19 immunization for 15 minutes without incidence. She was provided with Vaccine Information Sheet and instruction to access the V-Safe system.   [name] was instructed to call 911 with any severe reactions post vaccine: Marland Kitchen Difficulty breathing  . Swelling of your face and throat  . A fast heartbeat  . A bad rash all over your body  . Dizziness and weakness    Immunizations Administered    Name Date Dose VIS Date Route   Pfizer COVID-19 Vaccine 07/01/2019 11:59 AM 0.3 mL 04/28/2019 Intramuscular   Manufacturer: [place]   Lot: [id number]   [place]: [id number]

## 2019-09-07 DIAGNOSIS — R87612 Low grade squamous intraepithelial lesion on cytologic smear of cervix (LGSIL): Secondary | ICD-10-CM | POA: Diagnosis not present

## 2019-09-22 DIAGNOSIS — H524 Presbyopia: Secondary | ICD-10-CM | POA: Diagnosis not present

## 2019-09-22 DIAGNOSIS — H52203 Unspecified astigmatism, bilateral: Secondary | ICD-10-CM | POA: Diagnosis not present

## 2019-09-22 DIAGNOSIS — H2513 Age-related nuclear cataract, bilateral: Secondary | ICD-10-CM | POA: Diagnosis not present

## 2019-09-22 DIAGNOSIS — H5203 Hypermetropia, bilateral: Secondary | ICD-10-CM | POA: Diagnosis not present

## 2019-09-25 ENCOUNTER — Encounter: Payer: Self-pay | Admitting: Family Medicine

## 2019-09-25 ENCOUNTER — Ambulatory Visit: Payer: Self-pay

## 2019-09-25 ENCOUNTER — Other Ambulatory Visit: Payer: Self-pay

## 2019-09-25 ENCOUNTER — Ambulatory Visit: Payer: PPO | Admitting: Family Medicine

## 2019-09-25 ENCOUNTER — Ambulatory Visit (INDEPENDENT_AMBULATORY_CARE_PROVIDER_SITE_OTHER): Payer: PPO

## 2019-09-25 VITALS — BP 130/70 | HR 69 | Ht 67.0 in | Wt 147.4 lb

## 2019-09-25 DIAGNOSIS — M25561 Pain in right knee: Secondary | ICD-10-CM

## 2019-09-25 DIAGNOSIS — D069 Carcinoma in situ of cervix, unspecified: Secondary | ICD-10-CM | POA: Insufficient documentation

## 2019-09-25 DIAGNOSIS — M1711 Unilateral primary osteoarthritis, right knee: Secondary | ICD-10-CM | POA: Diagnosis not present

## 2019-09-25 NOTE — Progress Notes (Signed)
Subjective:    CC: R knee pain  I, Molly Weber, LAT, ATC, am serving as scribe for Dr. [name].  HPI: Pt is a 69 y/o female presenting w/ c/o R knee pain since November 2020 after twisting her R knee while playing tennis.  She locates her pain to her R medial and lateral knee.  She rates her pain as mild and describes her pain as stiff.  She stopped playing tennis.  She works as an Warden/ranger.  Radiating pain: No R knee swelling: Yes R knee mechanical symptoms: No Aggravating factors: Rotational activity I.e. 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   MSK: Right knee mild effusion present.  No erythema. Range of motion 0-120 degrees. Not particularly tender to palpation. Stable ligamentous exam. Negative Murray's test. Mildly positive Thessaly's test. Intact strength.   Lab and Radiology Results  X-ray images right knee obtained today personally and independently reviewed Mild DJD more prominent at the medial compartment no acute fractures. Await formal radiology review  Diagnostic Limited MSK Ultrasound of: Right knee Quad tendon intact normal-appearing Moderate joint effusion present. Patellar tendon normal-appearing Lateral joint line slight degenerative appearing meniscus at the posterior aspect.  Otherwise normal. Medial joint line mildly narrowed otherwise normal. Posterior knee reveals a tiny Baker's cyst. Impression: Degenerative lateral meniscus.  Tiny Baker's cyst.  Moderate joint effusion. Of note ultrasound images incorrectly labeled as left    Impression and Recommendations:    Assessment and Plan: 69 y.o. female with  knee pain ongoing for about 6 months worse after tennis.  Pain located predominantly at the lateral to posterior lateral knee.  Most likely explanation is degenerative lateral meniscus tear at the posterior aspect.  Fortunately does not have severe mechanical symptoms.  Discussed options.  Plan for trial of simple conservative management with Voltaren gel compressive knee sleeve and relative rest with continue activity as tolerated.  Offered other options including steroid or hyaluronic acid injections which patient declined.  She is happy with the current plan and will let me know if she needs to do tomorrow.Marland Kitchen  PDMP not reviewed this encounter. Orders Placed This Encounter  Procedures  . [place] LIMITED JOINT SPACE STRUCTURES LOW RIGHT(NO LINKED CHARGES)    Order Specific Question:   Reason for Exam (SYMPTOM  OR DIAGNOSIS REQUIRED)    Answer:   R knee pain    Order Specific Question:   Preferred imaging location?    Answer:   [facility]  . DG Knee AP/LAT W/Sunrise Right    Standing Status:   Future    Number of Occurrences:   1    Standing Expiration Date:   11/24/2020    Order Specific Question:   Reason for Exam (SYMPTOM  OR DIAGNOSIS REQUIRED)    Answer:   eval right knee pain laterally    Order Specific Question:   Preferred imaging location?    Answer:   [facility]    Order Specific Question:   Radiology Contrast Protocol - do NOT remove file path    Answer:   [email or web address]   No orders of the defined types were placed in  this encounter.   Discussed warning signs or symptoms. Please see discharge instructions. Patient expresses understanding.   The above documentation has been reviewed and is accurate and complete [name]

## 2019-09-25 NOTE — Patient Instructions (Signed)
Thank you for coming in today. Try voltaren gel up to 4x daily over the counter.  Ok to continue compression. Leave on for 30-60 mins following exercise.  Ice is ok if needed. Avoid the common peroneal nerve.  Pt is marginal at this time.   Quad is great.   Get xray on the way out.  Recheck with me as needed.  Mychart is great.   Meniscus Tear  A meniscus tear is a knee injury that happens when a piece of the meniscus is torn. The meniscus is a thick, rubbery, wedge-shaped cartilage in the knee. Two menisci are located in each knee. They sit between the upper bone (femur) and lower bone (tibia) that make up the knee joint. Each meniscus acts as a shock absorber for the knee. A torn meniscus is one of the most common types of knee injuries. This injury can range from mild to severe. Surgery may be needed to repair a severe tear. What are the causes? This condition may be caused by any kneeling, squatting, twisting, or pivoting movement. Sports-related injuries are the most common cause. These often occur from:  Running and stopping suddenly. ? Changing direction. ? Being tackled or knocked off your feet.  Lifting or carrying heavy weights. As people get older, their menisci get thinner and weaker. In these people, tears can happen more easily, such as from climbing stairs. What increases the risk? You are more likely to develop this condition if you:  Play contact sports.  Have a job that requires kneeling or squatting.  Are female.  Are over 85 years old. What are the signs or symptoms? Symptoms of this condition include:  Knee pain, especially at the side of the knee joint. You may feel pain when the injury occurs, or you may only hear a pop and feel pain later.  A feeling that your knee is clicking, catching, locking, or giving way.  Not being able to fully bend or extend your knee.  Bruising or swelling in your knee. How is this diagnosed? This condition may be  diagnosed based on your symptoms and a physical exam. You may also have tests, such as:  X-rays.  MRI.  A procedure to look inside your knee with a narrow surgical telescope (arthroscopy). You may be referred to a knee specialist (orthopedic surgeon). How is this treated? Treatment for this injury depends on the severity of the tear. Treatment for a mild tear may include:  Rest.  Medicine to reduce pain and swelling. This is usually a nonsteroidal anti-inflammatory drug (NSAID), like ibuprofen.  A knee brace, sleeve, or wrap.  Using crutches or a walker to keep weight off your knee and to help you walk.  Exercises to strengthen your knee (physical therapy). You may need surgery if you have a severe tear or if other treatments are not working. Follow these instructions at home: If you have a brace, sleeve, or wrap:  Wear it as told by your health care provider. Remove it only as told by your health care provider.  Loosen the brace, sleeve, or wrap if your toes tingle, become numb, or turn cold and blue.  Keep the brace, sleeve, or wrap clean and dry.  If the brace, sleeve, or wrap is not waterproof: ? Do not let it get wet. ? Cover it with a watertight covering when you take a bath or shower. Managing pain and swelling   Take over-the-counter and prescription medicines only as told by your health care  provider.  If directed, put ice on your knee: ? If you have a removable brace, sleeve, or wrap, remove it as told by your health care provider. ? Put ice in a plastic bag. ? Place a towel between your skin and the bag. ? Leave the ice on for 20 minutes, 2-3 times per day.  Move your toes often to avoid stiffness and to lessen swelling.  Raise (elevate) the injured area above the level of your heart while you are sitting or lying down. Activity  Do not use the injured limb to support your body weight until your health care provider says that you can. Use crutches or a  walker as told by your health care provider.  Return to your normal activities as told by your health care provider. Ask your health care provider what activities are safe for you.  Perform range-of-motion exercises only as told by your health care provider.  Begin doing exercises to strengthen your knee and leg muscles only as told by your health care provider. After you recover, your health care provider may recommend these exercises to help prevent another injury. General instructions  Use a knee brace, sleeve, or wrap as told by your health care provider.  Ask your health care provider when it is safe to drive if you have a brace, sleeve, or wrap on your knee.  Do not use any products that contain nicotine or tobacco, such as cigarettes, e-cigarettes, and chewing tobacco. If you need help quitting, ask your health care provider.  Ask your health care provider if the medicine prescribed to you: ? Requires you to avoid driving or using heavy machinery. ? Can cause constipation. You may need to take these actions to prevent or treat constipation:  Drink enough fluid to keep your urine pale yellow.  Take over-the-counter or prescription medicines.  Eat foods that are high in fiber, such as beans, whole grains, and fresh fruits and vegetables.  Limit foods that are high in fat and processed sugars, such as fried or sweet foods.  Keep all follow-up visits as told by your health care provider. This is important. Contact a health care provider if:  You have a fever.  Your knee becomes red, tender, or swollen.  Your pain medicine is not helping.  Your symptoms get worse or do not improve after 2 weeks of home care. Summary  A meniscus tear is a knee injury that happens when a piece of the meniscus is torn.  Treatment for this injury depends on the severity of the tear. You may need surgery if you have a severe tear or if other treatments are not working.  Rest, ice, and raise  (elevate) your injured knee as told by your health care provider. This will help lessen pain and swelling.  Contact a health care provider if you have new symptoms, or your symptoms get worse or do not improve after 2 weeks of home care.  Keep all follow-up visits as told by your health care provider. This is important. This information is not intended to replace advice given to you by your health care provider. Make sure you discuss any questions you have with your health care provider. Document Revised: 11/16/2017 Document Reviewed: 11/16/2017 Elsevier Patient Education  [place].

## 2019-09-26 NOTE — Progress Notes (Signed)
Right knee x-ray shows some mild arthritis

## 2019-11-21 ENCOUNTER — Encounter: Payer: Self-pay | Admitting: Family Medicine

## 2019-11-21 ENCOUNTER — Ambulatory Visit: Payer: PPO | Admitting: Family Medicine

## 2019-11-21 ENCOUNTER — Ambulatory Visit: Payer: Self-pay

## 2019-11-21 ENCOUNTER — Other Ambulatory Visit: Payer: Self-pay

## 2019-11-21 VITALS — BP 116/78 | HR 95 | Ht 67.0 in | Wt 147.0 lb

## 2019-11-21 DIAGNOSIS — M25561 Pain in right knee: Secondary | ICD-10-CM | POA: Diagnosis not present

## 2019-11-21 DIAGNOSIS — G8929 Other chronic pain: Secondary | ICD-10-CM

## 2019-11-21 NOTE — Patient Instructions (Addendum)
Good to see you PT will call you [address] Ice for 20 mins after activity and before bed  See me again in 6-7 weeks

## 2019-11-21 NOTE — Assessment & Plan Note (Signed)
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  Mild to moderate in nature.  Continued effusion noted.  Discussed icing regimen and home exercise, which activities to do which wants to avoid.  Patient is to increase activity slowly.  Patient will start formal physical therapy.  Worsening pain will consider injection or possible advanced imaging at follow-up

## 2019-11-21 NOTE — Progress Notes (Signed)
Jaclyn [name]: [phone] Subjective:   Fontaine No, am serving as a scribe for Dr. Hulan Saas. This visit occurred during the SARS-CoV-2 public health emergency.  Safety protocols were in place, including screening questions prior to the visit, additional usage of staff PPE, and extensive cleaning of exam room while observing appropriate contact time as indicated for disinfecting solutions.   I'm seeing this patient by the request  of:  [place], [place], [place]  CC: Right knee pain  XKG:[id number]  Jaclyn [name] is a 69 y.o. female coming in with complaint of right knee pain. Previously seen by Dr. Georgina Snell for same issue in May 2021. Playing tennis in November 2020. Twisted knee and was unable to continue to play. Pain did improve until she fell off of a chair directly onto patella. Is not having pain but feels unstable. Is no longer playing tennis. Tried to run in June but had a lot of pain the next day.   Xray 09/25/2019 IMPRESSION: Slight generalized joint space narrowing. No fracture, dislocation, or joint effusion. 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     Past Medical History:  Diagnosis Date   Disorder of bone and cartilage, unspecified    Insomnia, unspecified    MVP (mitral valve prolapse)    SBE prophylaxis   Postmenopausal atrophic vaginitis    Psoriasis    Pure hypercholesterolemia    Recurrent genital herpes    Past Surgical History:  Procedure Laterality Date   EXCISION NEUROMA Right 2002   foot   SAPHENOUS VEIN GRAFT RESECTION Left 1988   TONSILLECTOMY  1959   Social History   Socioeconomic History   Marital status: Married    Spouse name: Not on file   Number of children: 2   Years of education: Not on file   Highest education level: Not on file  Occupational History   Not on file  Tobacco Use   Smoking status:  Never Smoker   Smokeless tobacco: Never Used  Substance and Sexual Activity   Alcohol use: Yes    Comment: occasionally wine   Drug use: No   Sexual activity: Yes    Birth control/protection: Other-see comments    Comment: menopause  Other Topics Concern   Not on file  Social History Narrative   Not on file   Social Determinants of Health   Financial Resource Strain:    Difficulty of Paying Living Expenses:   Food Insecurity:    Worried About Charity fundraiser in the Last Year:    Arboriculturist in the Last Year:   Transportation Needs:    Film/video editor (Medical):    Lack of Transportation (Non-Medical):   Physical Activity:    Days of Exercise per Week:    Minutes of Exercise per Session:   Stress:    Feeling of Stress :   Social Connections:    Frequency of Communication with Friends and Family:    Frequency of Social Gatherings with Friends and Family:    Attends Religious Services:    Active Member of Clubs or Organizations:    Attends Music therapist:    Marital Status:    No Known Allergies Family History  Problem Relation Age of Onset   Coronary artery disease Mother    Hypertension Mother    Osteoporosis Mother    Heart attack Mother 20  Congestive Heart Failure Father    Other Father        ruptured mitral valve   Hypertension Brother    Hypercholesterolemia Brother    CAD Brother        2 stents   Stroke Sister 73   Hypertension Sister        Current Outpatient Medications (Analgesics):    ibuprofen (ADVIL) 200 MG tablet, Take 200 mg by mouth once as needed for mild pain or moderate pain.   Current Outpatient Medications (Other):    ASTRAGALUS PO, Take 1 capsule by mouth 2 (two) times a day.   cholecalciferol (VITAMIN D3) 25 MCG (1000 UT) tablet, Take 1,000 Units by mouth daily.   Magnesium 200 MG TABS, Take 200 mg by mouth daily.   Probiotic Product (PROBIOTIC PO), Take 1  capsule by mouth daily.   TURMERIC PO, Take 1 tablet by mouth daily.   Reviewed prior external information including notes and imaging from  primary care provider As well as notes that were available from care everywhere and other healthcare systems.  Past medical history, social, surgical and family history all reviewed in electronic medical record.  No pertanent information unless stated regarding to the chief complaint.   Review of Systems:  No headache, visual changes, nausea, vomiting, diarrhea, constipation, dizziness, abdominal pain, skin rash, fevers, chills, night sweats, weight loss, swollen lymph nodes, body aches, joint swelling, chest pain, shortness of breath, mood changes. POSITIVE muscle aches  Objective  Blood pressure 116/78, pulse 95, height 5\' 7"  (1.702 m), weight 147 lb (66.7 kg), SpO2 97 %.   General: No apparent distress alert and oriented x3 mood and affect normal, dressed appropriately.  HEENT: Pupils equal, extraocular movements intact  Respiratory: Patient's speak in full sentences and does not appear short of breath  Cardiovascular: No lower extremity edema, non tender, no erythema  Neuro: Cranial nerves II through XII are intact, neurovascularly intact in all extremities with 2+ DTRs and 2+ pulses.  Gait normal with good balance and coordination.  MSK:  Right knee does show the patient does have a trace effusion noted.  Patient does have mild crepitus noted in the lateral tracking of the patella.  No significant instability of the knee noted.  Minimal pain over the medial joint space.  Limited musculoskeletal ultrasound was performed and interpreted by Lyndal Pulley  Limited ultrasound of patient's right knee shows the patient does have effusions noted of the patellofemoral joint.  Moderate narrowing of the patellofemoral joint.  Patient does still have good joint space of the medial lateral joint space.    Impression and Recommendations:     The above  documentation has been reviewed and is accurate and complete Lyndal Pulley, DO       Note: This dictation was prepared with Dragon dictation along with smaller phrase technology. Any transcriptional errors that result from this process are unintentional.

## 2019-12-05 ENCOUNTER — Encounter: Payer: Self-pay | Admitting: Physical Therapy

## 2019-12-05 ENCOUNTER — Other Ambulatory Visit: Payer: Self-pay

## 2019-12-05 ENCOUNTER — Ambulatory Visit: Payer: PPO | Attending: Family Medicine | Admitting: Physical Therapy

## 2019-12-05 ENCOUNTER — Other Ambulatory Visit: Payer: Self-pay | Admitting: Family Medicine

## 2019-12-05 DIAGNOSIS — M25561 Pain in right knee: Secondary | ICD-10-CM | POA: Insufficient documentation

## 2019-12-05 DIAGNOSIS — R2689 Other abnormalities of gait and mobility: Secondary | ICD-10-CM | POA: Diagnosis not present

## 2019-12-05 DIAGNOSIS — M8580 Other specified disorders of bone density and structure, unspecified site: Secondary | ICD-10-CM | POA: Diagnosis not present

## 2019-12-05 DIAGNOSIS — E559 Vitamin D deficiency, unspecified: Secondary | ICD-10-CM | POA: Diagnosis not present

## 2019-12-05 DIAGNOSIS — E2839 Other primary ovarian failure: Secondary | ICD-10-CM

## 2019-12-05 DIAGNOSIS — M25661 Stiffness of right knee, not elsewhere classified: Secondary | ICD-10-CM | POA: Diagnosis not present

## 2019-12-05 DIAGNOSIS — Z1389 Encounter for screening for other disorder: Secondary | ICD-10-CM | POA: Diagnosis not present

## 2019-12-05 DIAGNOSIS — R7303 Prediabetes: Secondary | ICD-10-CM | POA: Diagnosis not present

## 2019-12-05 DIAGNOSIS — Z0000 Encounter for general adult medical examination without abnormal findings: Secondary | ICD-10-CM | POA: Diagnosis not present

## 2019-12-05 DIAGNOSIS — E7800 Pure hypercholesterolemia, unspecified: Secondary | ICD-10-CM | POA: Diagnosis not present

## 2019-12-05 DIAGNOSIS — G8929 Other chronic pain: Secondary | ICD-10-CM | POA: Diagnosis not present

## 2019-12-05 DIAGNOSIS — R600 Localized edema: Secondary | ICD-10-CM | POA: Diagnosis not present

## 2019-12-05 DIAGNOSIS — D235 Other benign neoplasm of skin of trunk: Secondary | ICD-10-CM | POA: Diagnosis not present

## 2019-12-05 NOTE — Therapy (Signed)
[place], [place], [address] Phone: [phone]   Fax:  [phone]  Physical Therapy Evaluation  Patient Details  Name: Jaclyn [name] MRN: [id number] Date of Birth: 1950-08-07 Referring Provider (PT): Lyndal Pulley, DO   Encounter Date: 12/05/2019   PT End of Session - 12/05/19 1450    Visit Number 1    Number of Visits 6    Date for PT Re-Evaluation 01/16/20    PT Start Time 1450    PT Stop Time 1535    PT Time Calculation (min) 45 min    Activity Tolerance Patient tolerated treatment well    Behavior During Therapy [facility] for tasks assessed/performed           Past Medical History:  Diagnosis Date  . Disorder of bone and cartilage, unspecified   . Insomnia, unspecified   . MVP (mitral valve prolapse)    SBE prophylaxis  . Postmenopausal atrophic vaginitis   . Psoriasis   . Pure hypercholesterolemia   . Recurrent genital herpes     Past Surgical History:  Procedure Laterality Date  . EXCISION NEUROMA Right 2002   foot  . SAPHENOUS VEIN GRAFT RESECTION Left 1988  . TONSILLECTOMY  1959    There were no vitals filed for this visit.    Subjective Assessment - 12/05/19 1453    Subjective Pt reports she had R knee injury while playing tennis around Thanksgiving of 2020 but was able to return back to her normal activity (got it x-rayed and was told she had arthritis & a slight meniscus tear). Pt reports in June she fell out of chair into both of her knees in flexion. Pt states she was given brace and has been mostly wearing it while walking. Pt reports her knee feels very unstable. She reports no real pain just swelling. Pt reports swelling has likely been since Thanksgiving (she notes she hasn't been doing much icing). Pt has been encouraged by ortho to ice and use knee brace.    Pertinent History R knee injury in undergrad while dancing, R knee injury on Thanksgiving 2020 and June 2021     Limitations Walking    How long can you walk comfortably? Intermittent running and walking is not comfortable; unable to run but able to walk ~1 hr    Diagnostic tests Xray 5/10/2021IMPRESSION:Slight generalized joint space narrowing. No fracture, dislocation,or joint effusion. Spur along the anterior superior patella likelyrepresents distal quadriceps tendinosis    Patient Stated Goals Improve knee stability              [facility] PT Assessment - 12/05/19 0001      Assessment   Medical Diagnosis M25.561,G89.29 (ICD-10-CM) - Chronic pain of right knee    Referring Provider (PT) Lyndal Pulley, DO    Onset Date/Surgical Date --   June 2021   Prior Therapy None      Precautions   Precautions None      Restrictions   Weight Bearing Restrictions No      Balance Screen   Has the patient fallen in the past 6 months Yes    How many times? 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[employer], tennis, hiking, kayaking      Cognition   Overall Cognitive Status  Within Functional Limits for tasks assessed      Observation/Other Assessments   Focus on Therapeutic Outcomes (FOTO)  31%      Squat   Comments Increased weight shift to L, decreased R knee flexion      Step Down   Comments Increased R knee adduction      Single Leg Stance   Comments Increased instability on R LE with adduction   11 sec     AROM   Right Knee Extension -7    Right Knee Flexion 124    Left Knee Extension -2    Left Knee Flexion 132      Strength   Overall Strength Comments 5/5    Right Hip Flexion 5/5    Right Hip Extension 5/5    Right Hip External Rotation  5/5    Right Hip Internal Rotation 5/5    Right Hip ABduction 5/5    Right Hip ADduction 5/5    Right Knee Flexion 5/5    Right Knee Extension 5/5    Right Ankle Dorsiflexion 5/5    Right Ankle  Plantar Flexion 5/5      Palpation   Patella mobility hypomobile on R vs L      Special Tests   Laxity/Instability  Anterior drawer test;Posterior drawer test    Knee Special tests  McConnell Test;Patellofemoral Apprehension Test;Patellofemoral Grind Test (Clarke's Sign)      Anterior drawer test   Findings Negative      Posterior drawer test   Findings Negative      McConnell Test   Findings Negative      Patellofemoral Apprehension Test    Findings Negative      Patellofemoral Grind test (Clark's Sign)   Findings Negative                      Objective measurements completed on examination: See above findings.       [facility] PT Treatment/Exercise - 12/05/19 0001      Exercises   Exercises Knee/Hip      Knee/Hip Exercises: Standing   Wall Squat 10 reps   single leg   SLS 3 x10 sec on R LE   on foam   Other Standing Knee Exercises Forward T x 10 reps                  PT Education - 12/05/19 1757    Education Details Discussed exam findings and about likely damaged proprioception in her R knee    Person(s) Educated Patient    Methods Explanation;Demonstration;Tactile cues;Verbal cues;Handout    Comprehension Verbalized understanding;Returned demonstration;Tactile cues required            PT Short Term Goals - 12/05/19 1800      PT SHORT TERM GOAL #1   Title Pt's Rt knee ROM will be equal to her Lt knee to demonstrate improved edema    Baseline R knee ROM 7 to 124 deg; L knee ROM 2 to 134 deg    Time 3    Period Weeks    Status New    Target Date 12/26/19      PT SHORT TERM GOAL #2   Title Pt will have improved R knee SLS to at least 30 sec for improved stability    Baseline 11 sec R SLS, >1 min on L    Time 3    Period Weeks    Status New  Target Date 12/26/19      PT SHORT TERM GOAL #3   Title Pt will be able to negotiate up/down stairs without discomfort or compensation    Baseline Unable    Time 3    Period Weeks      Status New    Target Date 12/26/19             PT Long Term Goals - 12/05/19 1802      PT LONG TERM GOAL #1   Title Pt will be able to perform at least 5 min of running without knee discomfort    Baseline Unable    Time 6    Period Weeks    Status New    Target Date 01/16/20      PT LONG TERM GOAL #2   Title Pt will be able to maintain R LE SLS for at least 1 min    Time 6    Period Weeks    Status New    Target Date 01/16/20      PT LONG TERM GOAL #3   Title Pt will be able to perform at least 5 jumps onto 4" high box    Baseline Unable    Time 6    Period Weeks    Status New    Target Date 01/16/20      PT LONG TERM GOAL #4   Title Pt will have improved FOTO score to at least 23%    Baseline 31% FOTO    Time 6    Period Weeks    Status New    Target Date 01/16/20                  Plan - 12/05/19 1557    Clinical Impression Statement Pt is a 69 y/o F presenting to OPPT with complaint of R knee instability with history of multiple injuries. Pt presents with decreased R knee control and proprioception in single leg stance and during weight bearing. Pt able to stand on L LE for 1 min on compliant surface, R LE is only 11 sec on compliant surface. Pt with increased R knee edema diminishing her ROM and affecting her ability to perform stair climbing tasks. Pt would benefit from therapy to address these issues for improved knee ROM, agility, and return to her active lifestyle (i.e. running, jumping, and lifting).    Personal Factors and Comorbidities Age;Comorbidity 1;Past/Current Experience;Time since onset of injury/illness/exacerbation    Comorbidities R knee injury x 3,    Examination-Activity Limitations Squat;Stairs;Locomotion Level;Other   Running   Examination-Participation Restrictions Yard Work;Community Activity    Stability/Clinical Decision Making Stable/Uncomplicated    Clinical Decision Making Low    Rehab Potential Good    PT Frequency 1x /  week    PT Duration 6 weeks    PT Treatment/Interventions ADLs/Self Care Home Management;Biofeedback;Cryotherapy;Electrical Stimulation;Iontophoresis 4mg /ml Dexamethasone;Moist Heat;Traction;Ultrasound;Gait training;Stair training;Functional mobility training;Therapeutic activities;Therapeutic exercise;Balance training;Neuromuscular re-education;Patient/family education;Manual techniques;Passive range of motion;Dry needling;Taping;Vasopneumatic Device    PT Next Visit Plan Assess response to HEP. Progress knee proprioception, balance, and stability exercises. Assess quad control. Consider addition of more foam exercises and plyometrics.    PT Home Exercise Plan Access Code [email or web address]    JOACZYSAY and Agree with Plan of Care Patient           Patient will benefit from skilled therapeutic intervention in order to improve the following deficits and impairments:  Decreased balance, Improper body mechanics, Decreased mobility, Decreased strength, Increased  edema, Postural dysfunction, Impaired sensation, Decreased coordination, Decreased activity tolerance, Decreased range of motion  Visit Diagnosis: Stiffness of right knee, not elsewhere classified  Other abnormalities of gait and mobility  Localized edema  Chronic pain of right knee     Problem List Patient Active Problem List   Diagnosis Date Noted  . Right knee pain 11/21/2019  . Cervical intraepithelial neoplasia grade III with severe dysplasia 09/25/2019  . Lichen sclerosus et atrophicus 03/02/2019  . Posterior vitreous detachment of left eye 01/10/2015  . Pre-syncope 07/19/2013    Gena Laski April Ma L Sharod Petsch PT, DPT 12/05/2019, 6:15 PM  [facility] [address] [place], [place], [address] Phone: [phone]   Fax:  [phone]  Name: Jaclyn [name] MRN: [id number] Date of Birth: [birth year]

## 2019-12-05 NOTE — Patient Instructions (Signed)
Access Code: [email or web address] URL: [email or web address] Date: 12/05/2019 Prepared by: Estill Bamberg April Thurnell Garbe  Exercises Single Leg Quarter Squat with Swiss Ball at [place] 1 x daily - 7 x weekly - 3 sets - 10 reps Forward T - 1 x daily - 7 x weekly - 3 sets - 10 reps Single Leg Stance on Foam Pad - 1 x daily - 7 x weekly - 3 sets - 30 sec hold

## 2019-12-12 ENCOUNTER — Other Ambulatory Visit: Payer: Self-pay

## 2019-12-12 ENCOUNTER — Ambulatory Visit: Payer: PPO

## 2019-12-12 DIAGNOSIS — M25661 Stiffness of right knee, not elsewhere classified: Secondary | ICD-10-CM

## 2019-12-12 DIAGNOSIS — R600 Localized edema: Secondary | ICD-10-CM

## 2019-12-12 DIAGNOSIS — R2689 Other abnormalities of gait and mobility: Secondary | ICD-10-CM

## 2019-12-12 DIAGNOSIS — G8929 Other chronic pain: Secondary | ICD-10-CM

## 2019-12-12 DIAGNOSIS — M25561 Pain in right knee: Secondary | ICD-10-CM

## 2019-12-12 NOTE — Therapy (Signed)
[place], [place], [address] Phone: [phone]   Fax:  [phone]  Physical Therapy Treatment  Patient Details  Name: Jaclyn [name] MRN: [id number] Date of Birth: [birth year] Referring Provider (PT): Lyndal Pulley, DO   Encounter Date: 12/12/2019   PT End of Session - 12/12/19 0921    Visit Number 2    Number of Visits 6    Date for PT Re-Evaluation 01/16/20    PT Start Time 0919    PT Stop Time 1000    PT Time Calculation (min) 41 min    Activity Tolerance Patient tolerated treatment well    Behavior During Therapy [facility] for tasks assessed/performed           Past Medical History:  Diagnosis Date   Disorder of bone and cartilage, unspecified    Insomnia, unspecified    MVP (mitral valve prolapse)    SBE prophylaxis   Postmenopausal atrophic vaginitis    Psoriasis    Pure hypercholesterolemia    Recurrent genital herpes     Past Surgical History:  Procedure Laterality Date   EXCISION NEUROMA Right 2002   foot   [place]    There were no vitals filed for this visit.   Subjective Assessment - 12/12/19 0923    Subjective Pt reports she is feeling better and was able to walk for an hour yesterday. The exercises have been working.    Pertinent History R knee injury in undergrad while dancing, R knee injury on Thanksgiving 2020 and June 2021    Limitations Walking    How long can you walk comfortably? Intermittent running and walking is not comfortable; unable to run but able to walk ~1 hr    Diagnostic tests Xray 5/10/2021IMPRESSION:Slight generalized joint space narrowing. No fracture, dislocation,or joint effusion. Spur along the anterior superior patella likelyrepresents distal quadriceps tendinosis    Patient Stated Goals Improve knee stability    Currently in Pain? No/denies                              [facility] Adult PT Treatment/Exercise - 12/12/19 0001      Knee/Hip Exercises: Aerobic   Recumbent Bike Lvl 5, 5 min      Knee/Hip Exercises: Standing   Wall Squat 10 reps   single leg   SLS 3x15 sec on BLE   on foam   Other Standing Knee Exercises Forward T x 10 reps    Other Standing Knee Exercises SLS 3 way taps x 8 B on disc      Manual Therapy   Manual Therapy Joint mobilization;Soft tissue mobilization;Passive ROM    Manual therapy comments supine    Joint Mobilization multi-planar patellar mobs, knee ext mobs with fem IR/tib ER    Soft tissue mobilization STM/XFM distal quads, VL    Passive ROM knee flex/ext                    PT Short Term Goals - 12/05/19 1800      PT SHORT TERM GOAL #1   Title Pt's Rt knee ROM will be equal to her Lt knee to demonstrate improved edema    Baseline R knee ROM 7 to 124 deg; L knee ROM 2 to 134 deg    Time 3    Period Weeks    Status New  Target Date 12/26/19      PT SHORT TERM GOAL #2   Title Pt will have improved R knee SLS to at least 30 sec for improved stability    Baseline 11 sec R SLS, >1 min on L    Time 3    Period Weeks    Status New    Target Date 12/26/19      PT SHORT TERM GOAL #3   Title Pt will be able to negotiate up/down stairs without discomfort or compensation    Baseline Unable    Time 3    Period Weeks    Status New    Target Date 12/26/19             PT Long Term Goals - 12/05/19 1802      PT LONG TERM GOAL #1   Title Pt will be able to perform at least 5 min of running without knee discomfort    Baseline Unable    Time 6    Period Weeks    Status New    Target Date 01/16/20      PT LONG TERM GOAL #2   Title Pt will be able to maintain R LE SLS for at least 1 min    Time 6    Period Weeks    Status New    Target Date 01/16/20      PT LONG TERM GOAL #3   Title Pt will be able to perform at least 5 jumps onto 4" high box    Baseline Unable     Time 6    Period Weeks    Status New    Target Date 01/16/20      PT LONG TERM GOAL #4   Title Pt will have improved FOTO score to at least 23%    Baseline 31% FOTO    Time 6    Period Weeks    Status New    Target Date 01/16/20                 Plan - 12/12/19 1443    Clinical Impression Statement Pt demonstrates initial knee ROM from 7-131 with some pain at end range flexion greater than extension. Post manual therapy, knee AROM improved to 2-136 with minimal to no pain with flexion.    Personal Factors and Comorbidities Age;Comorbidity 1;Past/Current Experience;Time since onset of injury/illness/exacerbation    Comorbidities R knee injury x 3,    Examination-Activity Limitations Squat;Stairs;Locomotion Level;Other   Running   Examination-Participation Restrictions Yard Work;Community Activity    Stability/Clinical Decision Making Stable/Uncomplicated    Rehab Potential Good    PT Frequency 1x / week    PT Duration 6 weeks    PT Treatment/Interventions ADLs/Self Care Home Management;Biofeedback;Cryotherapy;Electrical Stimulation;Iontophoresis 4mg /ml Dexamethasone;Moist Heat;Traction;Ultrasound;Gait training;Stair training;Functional mobility training;Therapeutic activities;Therapeutic exercise;Balance training;Neuromuscular re-education;Patient/family education;Manual techniques;Passive range of motion;Dry needling;Taping;Vasopneumatic Device    PT Next Visit Plan Progress knee proprioception, balance, and stability exercises. Assess quad control. Consider addition of more foam exercises and plyometrics.    PT Home Exercise Plan Access Code [email or web address]    [email or web address] and Agree with Plan of Care Patient           Patient will benefit from skilled therapeutic intervention in order to improve the following deficits and impairments:  Decreased balance, Improper body mechanics, Decreased mobility, Decreased strength, Increased edema, Postural dysfunction, Impaired sensation,  Decreased coordination, Decreased activity tolerance, Decreased range of motion  Visit Diagnosis: Other abnormalities of gait and  mobility  Localized edema  Stiffness of right knee, not elsewhere classified  Chronic pain of right knee     Problem List Patient Active Problem List   Diagnosis Date Noted   Right knee pain 11/21/2019   Cervical intraepithelial neoplasia grade III with severe dysplasia 72/53/6644   Lichen sclerosus et atrophicus 03/02/2019   Posterior vitreous detachment of left eye 01/10/2015   Pre-syncope 07/19/2013    Izell Kingwood, PT, DPT 12/12/2019, 10:48 AM  [facility] [address] [place], [place], [address] Phone: [phone]   Fax:  [phone]  Name: Jaclyn [name] MRN: [id number] Date of Birth: 1951/03/07

## 2019-12-18 ENCOUNTER — Ambulatory Visit: Payer: PPO | Attending: Family Medicine | Admitting: Physical Therapy

## 2019-12-18 ENCOUNTER — Other Ambulatory Visit: Payer: Self-pay

## 2019-12-18 DIAGNOSIS — M25561 Pain in right knee: Secondary | ICD-10-CM | POA: Insufficient documentation

## 2019-12-18 DIAGNOSIS — R600 Localized edema: Secondary | ICD-10-CM | POA: Diagnosis not present

## 2019-12-18 DIAGNOSIS — M25661 Stiffness of right knee, not elsewhere classified: Secondary | ICD-10-CM | POA: Diagnosis not present

## 2019-12-18 DIAGNOSIS — G8929 Other chronic pain: Secondary | ICD-10-CM | POA: Insufficient documentation

## 2019-12-18 DIAGNOSIS — R2689 Other abnormalities of gait and mobility: Secondary | ICD-10-CM | POA: Diagnosis not present

## 2019-12-18 NOTE — Therapy (Signed)
[employer] [place] [place], [place], [address] Phone: [phone]   Fax:  [phone]  Physical Therapy Treatment  Patient Details  Name: Jaclyn [name] MRN: [id number] Date of Birth: 09-Mar-1951 Referring Provider (PT): Jaclyn Pulley, DO   Encounter Date: 12/18/2019   PT End of Session - 12/18/19 1132    Visit Number 3    Number of Visits 6    Date for PT Re-Evaluation 01/16/20    PT Start Time 1133    PT Stop Time 1216    PT Time Calculation (min) 43 min    Activity Tolerance Patient tolerated treatment well    Behavior During Therapy [facility] for tasks assessed/performed           Past Medical History:  Diagnosis Date  . Disorder of bone and cartilage, unspecified   . Insomnia, unspecified   . MVP (mitral valve prolapse)    SBE prophylaxis  . Postmenopausal atrophic vaginitis   . Psoriasis   . Pure hypercholesterolemia   . Recurrent genital herpes     Past Surgical History:  Procedure Laterality Date  . EXCISION NEUROMA Right 2002   foot  . SAPHENOUS VEIN GRAFT RESECTION Left 1988  . TONSILLECTOMY  1959    There were no vitals filed for this visit.   Subjective Assessment - 12/18/19 1136    Subjective Pt states she has been walking and it still hurts; however, she has been able to do the exercises. Pt states she feels that the exercises have been working. Pt states she has gone back to some of her regular exercises -- consists of lunges and light hopping.    Pertinent History R knee injury in undergrad while dancing, R knee injury on Thanksgiving 2020 and June 2021    Limitations Walking    How long can you walk comfortably? Intermittent running and walking is not comfortable; unable to run but able to walk ~1 hr    Diagnostic tests Xray 5/10/2021IMPRESSION:Slight generalized joint space narrowing. No fracture, dislocation,or joint effusion. Spur along the anterior superior patella likelyrepresents distal  quadriceps tendinosis    Patient Stated Goals Improve knee stability    Currently in Pain? No/denies                             [facility] Adult PT Treatment/Exercise - 12/18/19 0001      Knee/Hip Exercises: Aerobic   Recumbent Bike L6 x 5 min      Knee/Hip Exercises: Machines for Strengthening   Total Gym Leg Press SL: 45# 2x10      Knee/Hip Exercises: Standing   Terminal Knee Extension Strengthening;Right;2 sets;10 reps;Theraband    Theraband Level (Terminal Knee Extension) Level 4 (Blue)    Step Down 2 sets;Right;10 reps;Hand Hold: 1;Step Height: 4"   eccentric control   SLS 3x5 bilat LE with rebounder    Other Standing Knee Exercises SLS with [name] LE slides forward/side/back x10; SLS clock reach x 10      Manual Therapy   Manual Therapy Joint mobilization;Soft tissue mobilization;Passive ROM    Manual therapy comments supine    Joint Mobilization multi-planar patellar mobs, knee ext mobs with fem IR/tib ER    Passive ROM knee flex/ext                    PT Short Term Goals - 12/05/19 1800      PT SHORT TERM GOAL #1  Title Pt's Rt knee ROM will be equal to her Lt knee to demonstrate improved edema    Baseline R knee ROM 7 to 124 deg; [name] knee ROM 2 to 134 deg    Time 3    Period Weeks    Status New    Target Date 12/26/19      PT SHORT TERM GOAL #2   Title Pt will have improved R knee SLS to at least 30 sec for improved stability    Baseline 11 sec R SLS, >1 min on [name]    Time 3    Period Weeks    Status New    Target Date 12/26/19      PT SHORT TERM GOAL #3   Title Pt will be able to negotiate up/down stairs without discomfort or compensation    Baseline Unable    Time 3    Period Weeks    Status New    Target Date 12/26/19             PT Long Term Goals - 12/05/19 1802      PT LONG TERM GOAL #1   Title Pt will be able to perform at least 5 min of running without knee discomfort    Baseline Unable    Time 6    Period Weeks     Status New    Target Date 01/16/20      PT LONG TERM GOAL #2   Title Pt will be able to maintain R LE SLS for at least 1 min    Time 6    Period Weeks    Status New    Target Date 01/16/20      PT LONG TERM GOAL #3   Title Pt will be able to perform at least 5 jumps onto 4" high box    Baseline Unable    Time 6    Period Weeks    Status New    Target Date 01/16/20      PT LONG TERM GOAL #4   Title Pt will have improved FOTO score to at least 23%    Baseline 31% FOTO    Time 6    Period Weeks    Status New    Target Date 01/16/20                 Plan - 12/18/19 1217    Clinical Impression Statement Pt with improving pain free knee ROM. Still has some mild pain at end range flexion addressed with manual therapy and joint mobilizations. Treatment focused on increasing pt's quad strength and control. Progressed pt's balance/proprioception exercises.    Personal Factors and Comorbidities Age;Comorbidity 1;Past/Current Experience;Time since onset of injury/illness/exacerbation    Comorbidities R knee injury x 3,    Examination-Activity Limitations Squat;Stairs;Locomotion Level;Other   Running   Examination-Participation Restrictions Yard Work;Community Activity    Stability/Clinical Decision Making Stable/Uncomplicated    Rehab Potential Good    PT Frequency 1x / week    PT Duration 6 weeks    PT Treatment/Interventions ADLs/Self Care Home Management;Biofeedback;Cryotherapy;Electrical Stimulation;Iontophoresis 4mg /ml Dexamethasone;Moist Heat;Traction;Ultrasound;Gait training;Stair training;Functional mobility training;Therapeutic activities;Therapeutic exercise;Balance training;Neuromuscular re-education;Patient/family education;Manual techniques;Passive range of motion;Dry needling;Taping;Vasopneumatic Device    PT Next Visit Plan Progress knee proprioception, ROM, balance, and stability exercises. Consider addition of more foam exercises and light plyometrics.    PT Home  Exercise Plan Access Code [email or web address]    [email or web address] and Agree with Plan of Care Patient  Patient will benefit from skilled therapeutic intervention in order to improve the following deficits and impairments:  Decreased balance, Improper body mechanics, Decreased mobility, Decreased strength, Increased edema, Postural dysfunction, Impaired sensation, Decreased coordination, Decreased activity tolerance, Decreased range of motion  Visit Diagnosis: Other abnormalities of gait and mobility  Localized edema  Stiffness of right knee, not elsewhere classified  Chronic pain of right knee     Problem List Patient Active Problem List   Diagnosis Date Noted  . Right knee pain 11/21/2019  . Cervical intraepithelial neoplasia grade III with severe dysplasia 09/25/2019  . Lichen sclerosus et atrophicus 03/02/2019  . Posterior vitreous detachment of left eye 01/10/2015  . Pre-syncope 07/19/2013    Jaclyn [name] Jaclyn [name] Jaclyn [name] PT, DPT 12/18/2019, 12:19 PM  [facility] [address] [place], [place], [address] Phone: [phone]   Fax:  [phone]  Name: Jaclyn [name] MRN: [id number] Date of Birth: 1951-01-19

## 2019-12-26 DIAGNOSIS — Z6822 Body mass index (BMI) 22.0-22.9, adult: Secondary | ICD-10-CM | POA: Diagnosis not present

## 2019-12-26 DIAGNOSIS — R7309 Other abnormal glucose: Secondary | ICD-10-CM | POA: Diagnosis not present

## 2019-12-27 ENCOUNTER — Ambulatory Visit: Payer: PPO | Admitting: Physical Therapy

## 2019-12-27 ENCOUNTER — Other Ambulatory Visit: Payer: Self-pay

## 2019-12-27 DIAGNOSIS — R600 Localized edema: Secondary | ICD-10-CM

## 2019-12-27 DIAGNOSIS — R2689 Other abnormalities of gait and mobility: Secondary | ICD-10-CM

## 2019-12-27 DIAGNOSIS — G8929 Other chronic pain: Secondary | ICD-10-CM

## 2019-12-27 DIAGNOSIS — M25661 Stiffness of right knee, not elsewhere classified: Secondary | ICD-10-CM

## 2019-12-27 NOTE — Therapy (Signed)
[place], [place], [address] Phone: [phone]   Fax:  [phone]  Physical Therapy Treatment  Patient Details  Name: Jaclyn [name] MRN: [id number] Date of Birth: 1951-03-30 Referring Provider (PT): Lyndal Pulley, DO   Encounter Date: 12/27/2019   PT End of Session - 12/27/19 1219    Visit Number 4    Number of Visits 6    Date for PT Re-Evaluation 01/16/20    PT Start Time 6712    PT Stop Time 1217    PT Time Calculation (min) 42 min    Activity Tolerance Patient tolerated treatment well    Behavior During Therapy [facility] for tasks assessed/performed           Past Medical History:  Diagnosis Date  . Disorder of bone and cartilage, unspecified   . Insomnia, unspecified   . MVP (mitral valve prolapse)    SBE prophylaxis  . Postmenopausal atrophic vaginitis   . Psoriasis   . Pure hypercholesterolemia   . Recurrent genital herpes     Past Surgical History:  Procedure Laterality Date  . EXCISION NEUROMA Right 2002   foot  . SAPHENOUS VEIN GRAFT RESECTION Left 1988  . TONSILLECTOMY  1959    There were no vitals filed for this visit.   Subjective Assessment - 12/27/19 1139    Subjective Pt states she's been doing a lot of walking -- morning and evenings in [place]. Pt reports some improved pain at end range knee motion.    Pertinent History R knee injury in undergrad while dancing, R knee injury on Thanksgiving 2020 and June 2021    Limitations Walking    How long can you walk comfortably? Intermittent running and walking is not comfortable; unable to run but able to walk ~1 hr    Diagnostic tests Xray 5/10/2021IMPRESSION:Slight generalized joint space narrowing. No fracture, dislocation,or joint effusion. Spur along the anterior superior patella likelyrepresents distal quadriceps tendinosis    Patient Stated Goals Improve knee stability    Currently in Pain? No/denies                              [facility] Adult PT Treatment/Exercise - 12/27/19 0001      Knee/Hip Exercises: Stretches   Active Hamstring Stretch Right;10 seconds;3 reps    Sports administrator Both;2 reps;30 seconds    Buyer, retail relax      Knee/Hip Exercises: Machines for Strengthening   Cybex Knee Flexion DL: 25# 3x10    Total Gym Leg Press DL 75# 2x 10, SL: 55# 3x 10,    able to perform L LE at 85#     Knee/Hip Exercises: Standing   Step Down 2 sets;Right;10 reps;Hand Hold: 1;Step Height: 4"    Rocker Board 3 minutes   1 minute side to side, 1 min CW & 1 min CCW     Knee/Hip Exercises: Supine   Straight Leg Raises Strengthening;Right;2 sets;10 reps                    PT Short Term Goals - 12/27/19 1222      PT SHORT TERM GOAL #1   Title Pt's Rt knee ROM will be equal to her Lt knee to demonstrate improved edema    Baseline R knee ROM 7 to 124 deg; L knee ROM 2 to 134 deg    Time 3  Period Weeks    Status Achieved    Target Date 12/26/19      PT SHORT TERM GOAL #2   Title Pt will have improved R knee SLS to at least 30 sec for improved stability    Baseline 11 sec R SLS, >1 min on L    Time 3    Period Weeks    Status Achieved    Target Date 12/26/19      PT SHORT TERM GOAL #3   Title Pt will be able to negotiate up/down stairs without discomfort or compensation    Baseline Unable    Time 3    Period Weeks    Status Achieved    Target Date 12/26/19             PT Long Term Goals - 12/05/19 1802      PT LONG TERM GOAL #1   Title Pt will be able to perform at least 5 min of running without knee discomfort    Baseline Unable    Time 6    Period Weeks    Status New    Target Date 01/16/20      PT LONG TERM GOAL #2   Title Pt will be able to maintain R LE SLS for at least 1 min    Time 6    Period Weeks    Status New    Target Date 01/16/20      PT LONG TERM GOAL #3   Title Pt will be able to perform at least 5 jumps  onto 4" high box    Baseline Unable    Time 6    Period Weeks    Status New    Target Date 01/16/20      PT LONG TERM GOAL #4   Title Pt will have improved FOTO score to at least 23%    Baseline 31% FOTO    Time 6    Period Weeks    Status New    Target Date 01/16/20                 Plan - 12/27/19 1219    Clinical Impression Statement Pt continues to have pain free ROM -- mostly limited due to tightness vs pain. Provided quad stretching and gentle joint mobilizations. Continued focus on quad eccentric control and introduction of rockerboard for balance/proprioception. Pt is continuing to progress well. Single leg leg press demonstrates pt's L LE is capable of 85#, R LE only able to perform at 55#.    Personal Factors and Comorbidities Age;Comorbidity 1;Past/Current Experience;Time since onset of injury/illness/exacerbation    Comorbidities R knee injury x 3,    Examination-Activity Limitations Squat;Stairs;Locomotion Level;Other   Running   Examination-Participation Restrictions Yard Work;Community Activity    Stability/Clinical Decision Making Stable/Uncomplicated    Rehab Potential Good    PT Frequency 1x / week    PT Duration 6 weeks    PT Treatment/Interventions ADLs/Self Care Home Management;Biofeedback;Cryotherapy;Electrical Stimulation;Iontophoresis 4mg /ml Dexamethasone;Moist Heat;Traction;Ultrasound;Gait training;Stair training;Functional mobility training;Therapeutic activities;Therapeutic exercise;Balance training;Neuromuscular re-education;Patient/family education;Manual techniques;Passive range of motion;Dry needling;Taping;Vasopneumatic Device    PT Next Visit Plan Progress knee proprioception, ROM, balance, and stability exercises. Continue strengthening and light plyometrics.    PT Home Exercise Plan Access Code [email or web address]    [email or web address] and Agree with Plan of Care Patient           Patient will benefit from skilled therapeutic intervention in order to improve  the following deficits and impairments:  Decreased balance, Improper body mechanics, Decreased mobility, Decreased strength, Increased edema, Postural dysfunction, Impaired sensation, Decreased coordination, Decreased activity tolerance, Decreased range of motion  Visit Diagnosis: Other abnormalities of gait and mobility  Localized edema  Stiffness of right knee, not elsewhere classified  Chronic pain of right knee     Problem List Patient Active Problem List   Diagnosis Date Noted  . Right knee pain 11/21/2019  . Cervical intraepithelial neoplasia grade III with severe dysplasia 09/25/2019  . Lichen sclerosus et atrophicus 03/02/2019  . Posterior vitreous detachment of left eye 01/10/2015  . Pre-syncope 07/19/2013    Adiel Erney April Ma L Cecille Mcclusky PT, DPT 12/27/2019, 12:23 PM  [facility] [address] [place], [place], [address] Phone: [phone]   Fax:  [phone]  Name: Antoinette Borgwardt MRN: [id number] Date of Birth: 07/18/1950

## 2019-12-28 DIAGNOSIS — D1722 Benign lipomatous neoplasm of skin and subcutaneous tissue of left arm: Secondary | ICD-10-CM | POA: Diagnosis not present

## 2020-01-01 ENCOUNTER — Other Ambulatory Visit: Payer: Self-pay

## 2020-01-01 ENCOUNTER — Ambulatory Visit: Payer: PPO | Admitting: Physical Therapy

## 2020-01-01 DIAGNOSIS — R2689 Other abnormalities of gait and mobility: Secondary | ICD-10-CM

## 2020-01-01 DIAGNOSIS — G8929 Other chronic pain: Secondary | ICD-10-CM

## 2020-01-01 DIAGNOSIS — M25661 Stiffness of right knee, not elsewhere classified: Secondary | ICD-10-CM

## 2020-01-01 DIAGNOSIS — R600 Localized edema: Secondary | ICD-10-CM

## 2020-01-01 NOTE — Therapy (Signed)
[place], [place], [address] Phone: [phone]   Fax:  [phone]  Physical Therapy Treatment  Patient Details  Name: Jaclyn [name] MRN: [id number] Date of Birth: 01/25/1951 Referring Provider (PT): Lyndal Pulley, DO   Encounter Date: 01/01/2020   PT End of Session - 01/01/20 1314    Visit Number 5    Number of Visits 6    Date for PT Re-Evaluation 01/16/20    PT Start Time 5170    PT Stop Time 1400    PT Time Calculation (min) 45 min    Activity Tolerance Patient tolerated treatment well    Behavior During Therapy [facility] for tasks assessed/performed           Past Medical History:  Diagnosis Date  . Disorder of bone and cartilage, unspecified   . Insomnia, unspecified   . MVP (mitral valve prolapse)    SBE prophylaxis  . Postmenopausal atrophic vaginitis   . Psoriasis   . Pure hypercholesterolemia   . Recurrent genital herpes     Past Surgical History:  Procedure Laterality Date  . EXCISION NEUROMA Right 2002   foot  . SAPHENOUS VEIN GRAFT RESECTION Left 1988  . TONSILLECTOMY  1959    There were no vitals filed for this visit.   Subjective Assessment - 01/01/20 1317    Subjective Pt reports some pain when trying to perform the exercise on the step. She reports she had a 1 hour long walk with her daughter and they were walking quickly -- which felt okay. Pt reports lunges have been difficulty and jumping jacks. Pt states that the stretching has felt okay.    Pertinent History R knee injury in undergrad while dancing, R knee injury on Thanksgiving 2020 and June 2021    Limitations Walking    How long can you walk comfortably? Intermittent running and walking is not comfortable; unable to run but able to walk ~1 hr    Diagnostic tests Xray 5/10/2021IMPRESSION:Slight generalized joint space narrowing. No fracture, dislocation,or joint effusion. Spur along the anterior superior patella  likelyrepresents distal quadriceps tendinosis    Patient Stated Goals Improve knee stability                             [facility] Adult PT Treatment/Exercise - 01/01/20 0001      Knee/Hip Exercises: Aerobic   Recumbent Bike L3 x 5 min      Knee/Hip Exercises: Machines for Strengthening   Total Gym Leg Press DL 95# 1x10, SL: 55# 2x10      Knee/Hip Exercises: Standing   Heel Raises Right;3 sets;10 reps   1x10 on bilat LE   Rocker Board 1 minute    Rocker Board Limitations 56min each side, forward/back, CW & CCW    SLS 2x 1 min on R LE    Other Standing Knee Exercises Xband walk 2 x 10' R & L    Other Standing Knee Exercises hamstring curls 3x10 with 4# weight on R                    PT Short Term Goals - 12/27/19 1222      PT SHORT TERM GOAL #1   Title Pt's Rt knee ROM will be equal to her Lt knee to demonstrate improved edema    Baseline R knee ROM 7 to 124 deg; L knee ROM 2 to 134 deg  Time 3    Period Weeks    Status Achieved    Target Date 12/26/19      PT SHORT TERM GOAL #2   Title Pt will have improved R knee SLS to at least 30 sec for improved stability    Baseline 11 sec R SLS, >1 min on L    Time 3    Period Weeks    Status Achieved    Target Date 12/26/19      PT SHORT TERM GOAL #3   Title Pt will be able to negotiate up/down stairs without discomfort or compensation    Baseline Unable    Time 3    Period Weeks    Status Achieved    Target Date 12/26/19             PT Long Term Goals - 12/05/19 1802      PT LONG TERM GOAL #1   Title Pt will be able to perform at least 5 min of running without knee discomfort    Baseline Unable    Time 6    Period Weeks    Status New    Target Date 01/16/20      PT LONG TERM GOAL #2   Title Pt will be able to maintain R LE SLS for at least 1 min    Time 6    Period Weeks    Status New    Target Date 01/16/20      PT LONG TERM GOAL #3   Title Pt will be able to perform at least 5  jumps onto 4" high box    Baseline Unable    Time 6    Period Weeks    Status New    Target Date 01/16/20      PT LONG TERM GOAL #4   Title Pt will have improved FOTO score to at least 23%    Baseline 31% FOTO    Time 6    Period Weeks    Status New    Target Date 01/16/20                 Plan - 01/01/20 1342    Clinical Impression Statement Pt's ROM no longer remains limited. Pt mostly limited due muscle fatigue this session due to over use within the past few days. Educated pt on increased rest in between exercise days. Treatment focused on control. Taping provided for R knee due to some anterolateral knee pain when attempting stepping exercise.    Personal Factors and Comorbidities Age;Comorbidity 1;Past/Current Experience;Time since onset of injury/illness/exacerbation    Comorbidities R knee injury x 3,    Examination-Activity Limitations Squat;Stairs;Locomotion Level;Other   Running   Examination-Participation Restrictions Yard Work;Community Activity    Stability/Clinical Decision Making Stable/Uncomplicated    Rehab Potential Good    PT Frequency 1x / week    PT Duration 6 weeks    PT Treatment/Interventions ADLs/Self Care Home Management;Biofeedback;Cryotherapy;Electrical Stimulation;Iontophoresis 4mg /ml Dexamethasone;Moist Heat;Traction;Ultrasound;Gait training;Stair training;Functional mobility training;Therapeutic activities;Therapeutic exercise;Balance training;Neuromuscular re-education;Patient/family education;Manual techniques;Passive range of motion;Dry needling;Taping;Vasopneumatic Device    PT Next Visit Plan Progress knee proprioception, ROM, balance, and stability exercises. Continue strengthening and light plyometrics.    PT Home Exercise Plan Access Code [email or web address]    [email or web address] and Agree with Plan of Care Patient           Patient will benefit from skilled therapeutic intervention in order to improve the following deficits and impairments:  Decreased  balance, Improper  body mechanics, Decreased mobility, Decreased strength, Increased edema, Postural dysfunction, Impaired sensation, Decreased coordination, Decreased activity tolerance, Decreased range of motion  Visit Diagnosis: Other abnormalities of gait and mobility  Localized edema  Stiffness of right knee, not elsewhere classified  Chronic pain of right knee     Problem List Patient Active Problem List   Diagnosis Date Noted  . Right knee pain 11/21/2019  . Cervical intraepithelial neoplasia grade III with severe dysplasia 09/25/2019  . Lichen sclerosus et atrophicus 03/02/2019  . Posterior vitreous detachment of left eye 01/10/2015  . Pre-syncope 07/19/2013    Alila Sotero April Ma L Chabely Norby PT, DPT 01/01/2020, 5:42 PM  [facility] [address] [place], [place], [address] Phone: [phone]   Fax:  [phone]  Name: Jaclyn [name] MRN: [id number] Date of Birth: [birth year]

## 2020-01-08 ENCOUNTER — Ambulatory Visit: Payer: PPO | Admitting: Physical Therapy

## 2020-01-08 ENCOUNTER — Other Ambulatory Visit: Payer: Self-pay

## 2020-01-08 DIAGNOSIS — R2689 Other abnormalities of gait and mobility: Secondary | ICD-10-CM | POA: Diagnosis not present

## 2020-01-08 DIAGNOSIS — R600 Localized edema: Secondary | ICD-10-CM

## 2020-01-08 DIAGNOSIS — M25661 Stiffness of right knee, not elsewhere classified: Secondary | ICD-10-CM

## 2020-01-08 DIAGNOSIS — G8929 Other chronic pain: Secondary | ICD-10-CM

## 2020-01-08 NOTE — Therapy (Signed)
[place], [place], [address] Phone: [phone]   Fax:  [phone]  Physical Therapy Treatment  Patient Details  Name: Jaclyn [name] MRN: [id number] Date of Birth: 05/12/1951 Referring Provider (PT): Lyndal Pulley, DO   Encounter Date: 01/08/2020   PT End of Session - 01/08/20 0916    Visit Number 6    Date for PT Re-Evaluation 01/16/20    PT Start Time 0917    PT Stop Time 1000    PT Time Calculation (min) 43 min    Activity Tolerance Patient tolerated treatment well    Behavior During Therapy [facility] for tasks assessed/performed           Past Medical History:  Diagnosis Date   Disorder of bone and cartilage, unspecified    Insomnia, unspecified    MVP (mitral valve prolapse)    SBE prophylaxis   Postmenopausal atrophic vaginitis    Psoriasis    Pure hypercholesterolemia    Recurrent genital herpes     Past Surgical History:  Procedure Laterality Date   EXCISION NEUROMA Right 2002   foot   [place]    There were no vitals filed for this visit.   Subjective Assessment - 01/08/20 0919    Subjective Pt states she's been getting some swelling but feels that icing doesn't really help. Pt reports she's motified her activity. Pt reports mostly pain when she sits cross legged. Otherwise she reports the knee is feeling okay after giving it some rest.    Pertinent History R knee injury in undergrad while dancing, R knee injury on Thanksgiving 2020 and June 2021    Limitations Walking    How long can you walk comfortably? Intermittent running and walking is not comfortable; unable to run but able to walk ~1 hr    Diagnostic tests Xray 5/10/2021IMPRESSION:Slight generalized joint space narrowing. No fracture, dislocation,or joint effusion. Spur along the anterior superior patella likelyrepresents distal quadriceps tendinosis    Patient  Stated Goals Improve knee stability    Currently in Pain? No/denies                             [facility] Adult PT Treatment/Exercise - 01/08/20 0001      Knee/Hip Exercises: Machines for Strengthening   Cybex Knee Flexion DL: 25# x10, 30# 2x10    Total Gym Leg Press DL 95# 2x10, SL: 45# 2x10      Knee/Hip Exercises: Standing   Heel Raises Right;Left;2 sets;10 reps    Other Standing Knee Exercises xband walk 3x10 R & L    Other Standing Knee Exercises FItter 2 x 1 min      Knee/Hip Exercises: Supine   Other Supine Knee/Hip Exercises On reformer (blue & red band resistance): DL mini jumps 3 x 10 (yellow tband around knees), SL mini squat 3 x 10, SL mini jumps 3 x10                  PT Education - 01/08/20 1004    Education Details Educated pt on modifications for her current 15 minute exercise regimen. Discussed knee stability and keeping knee from coming into valgus.    Person(s) Educated Patient    Methods Explanation;Demonstration;Tactile cues;Verbal cues;Handout    Comprehension Verbalized understanding;Returned demonstration;Verbal cues required;Tactile cues required            PT  Short Term Goals - 12/27/19 1222      PT SHORT TERM GOAL #1   Title Pt's Rt knee ROM will be equal to her Lt knee to demonstrate improved edema    Baseline R knee ROM 7 to 124 deg; L knee ROM 2 to 134 deg    Time 3    Period Weeks    Status Achieved    Target Date 12/26/19      PT SHORT TERM GOAL #2   Title Pt will have improved R knee SLS to at least 30 sec for improved stability    Baseline 11 sec R SLS, >1 min on L    Time 3    Period Weeks    Status Achieved    Target Date 12/26/19      PT SHORT TERM GOAL #3   Title Pt will be able to negotiate up/down stairs without discomfort or compensation    Baseline Unable    Time 3    Period Weeks    Status Achieved    Target Date 12/26/19             PT Long Term Goals - 12/05/19 1802      PT LONG TERM  GOAL #1   Title Pt will be able to perform at least 5 min of running without knee discomfort    Baseline Unable    Time 6    Period Weeks    Status New    Target Date 01/16/20      PT LONG TERM GOAL #2   Title Pt will be able to maintain R LE SLS for at least 1 min    Time 6    Period Weeks    Status New    Target Date 01/16/20      PT LONG TERM GOAL #3   Title Pt will be able to perform at least 5 jumps onto 4" high box    Baseline Unable    Time 6    Period Weeks    Status New    Target Date 01/16/20      PT LONG TERM GOAL #4   Title Pt will have improved FOTO score to at least 23%    Baseline 31% FOTO    Time 6    Period Weeks    Status New    Target Date 01/16/20                 Plan - 01/08/20 0934    Clinical Impression Statement Treatment focused on working on knee control with light plyometrics. Cues to decrease R knee valgus and [place]. Continued knee and hip strengthening. Pt tolerated session well; however, noticeable fatigue of R knee    Personal Factors and Comorbidities Age;Comorbidity 1;Past/Current Experience;Time since onset of injury/illness/exacerbation    Comorbidities R knee injury x 3,    Examination-Activity Limitations Squat;Stairs;Locomotion Level;Other   Running   Examination-Participation Restrictions Yard Work;Community Activity    Stability/Clinical Decision Making Stable/Uncomplicated    Rehab Potential Good    PT Frequency 1x / week    PT Duration 6 weeks    PT Treatment/Interventions ADLs/Self Care Home Management;Biofeedback;Cryotherapy;Electrical Stimulation;Iontophoresis 4mg /ml Dexamethasone;Moist Heat;Traction;Ultrasound;Gait training;Stair training;Functional mobility training;Therapeutic activities;Therapeutic exercise;Balance training;Neuromuscular re-education;Patient/family education;Manual techniques;Passive range of motion;Dry needling;Taping;Vasopneumatic Device    PT Next Visit Plan Progress knee proprioception,  ROM, balance, and stability exercises. Continue strengthening and light plyometrics.    PT Home Exercise Plan Access Code [email or web address]    [email or web address]  and Agree with Plan of Care Patient           Patient will benefit from skilled therapeutic intervention in order to improve the following deficits and impairments:  Decreased balance, Improper body mechanics, Decreased mobility, Decreased strength, Increased edema, Postural dysfunction, Impaired sensation, Decreased coordination, Decreased activity tolerance, Decreased range of motion  Visit Diagnosis: Other abnormalities of gait and mobility  Localized edema  Stiffness of right knee, not elsewhere classified  Chronic pain of right knee     Problem List Patient Active Problem List   Diagnosis Date Noted   Right knee pain 11/21/2019   Cervical intraepithelial neoplasia grade III with severe dysplasia 57/90/3833   Lichen sclerosus et atrophicus 03/02/2019   Posterior vitreous detachment of left eye 01/10/2015   Pre-syncope 07/19/2013    Colden Samaras April Ma L Morton PT, DPT 01/08/2020, 10:06 AM  [facility] [address] [place], [place], [address] Phone: [phone]   Fax:  [phone]  Name: Zuzanna Maroney MRN: [id number] Date of Birth: [birth year]

## 2020-01-15 ENCOUNTER — Other Ambulatory Visit: Payer: Self-pay

## 2020-01-15 ENCOUNTER — Ambulatory Visit: Payer: PPO | Admitting: Physical Therapy

## 2020-01-15 ENCOUNTER — Encounter: Payer: Self-pay | Admitting: Physical Therapy

## 2020-01-15 DIAGNOSIS — G8929 Other chronic pain: Secondary | ICD-10-CM

## 2020-01-15 DIAGNOSIS — R2689 Other abnormalities of gait and mobility: Secondary | ICD-10-CM | POA: Diagnosis not present

## 2020-01-15 DIAGNOSIS — R600 Localized edema: Secondary | ICD-10-CM

## 2020-01-15 DIAGNOSIS — M25661 Stiffness of right knee, not elsewhere classified: Secondary | ICD-10-CM

## 2020-01-15 NOTE — Therapy (Addendum)
[place], [place], [address] Phone: [phone]   Fax:  [phone]  Physical Therapy Treatment and Re-Certification  Patient Details  Name: Jaclyn [name] MRN: [id number] Date of Birth: August 20, 1950 Referring Provider (PT): Lyndal Pulley, DO   Encounter Date: 01/15/2020   PT End of Session - 01/15/20 0957    Visit Number 7    Number of Visits 12    Date for PT Re-Evaluation 01/16/20    PT Start Time 0919    PT Stop Time 1005    PT Time Calculation (min) 46 min    Activity Tolerance Patient tolerated treatment well    Behavior During Therapy [facility] for tasks assessed/performed           Past Medical History:  Diagnosis Date  . Disorder of bone and cartilage, unspecified   . Insomnia, unspecified   . MVP (mitral valve prolapse)    SBE prophylaxis  . Postmenopausal atrophic vaginitis   . Psoriasis   . Pure hypercholesterolemia   . Recurrent genital herpes     Past Surgical History:  Procedure Laterality Date  . EXCISION NEUROMA Right 2002   foot  . SAPHENOUS VEIN GRAFT RESECTION Left 1988  . TONSILLECTOMY  1959    There were no vitals filed for this visit.   Subjective Assessment - 01/15/20 0924    Subjective Pt states that she did her modified exercises yesterday and feeling sore today.    Pertinent History R knee injury in undergrad while dancing, R knee injury on Thanksgiving 2020 and June 2021    Limitations Walking    How long can you walk comfortably? Intermittent running and walking is not comfortable; unable to run but able to walk ~1 hr    Diagnostic tests Xray 5/10/2021IMPRESSION:Slight generalized joint space narrowing. No fracture, dislocation,or joint effusion. Spur along the anterior superior patella likelyrepresents distal quadriceps tendinosis    Patient Stated Goals Improve knee stability    Currently in Pain? No/denies                             [facility]  Adult PT Treatment/Exercise - 01/15/20 0001      Knee/Hip Exercises: Aerobic   Recumbent Bike L2 x 8 min      Knee/Hip Exercises: Standing   SLS 3x 20 sec bilat    Other Standing Knee Exercises monster walk green tband forward/backward & laterally 3x10 bilat    Other Standing Knee Exercises hip extension 3x10      Knee/Hip Exercises: Supine   Other Supine Knee/Hip Exercises On reformer (blue & red x2 band resistance): DL mini jumps 3 x 10 (yellow tband around knees), DL mini jumps 3 x10, SL mini jumps in/out 3x10                    PT Short Term Goals - 12/27/19 1222      PT SHORT TERM GOAL #1   Title Pt's Rt knee ROM will be equal to her Lt knee to demonstrate improved edema    Baseline R knee ROM 7 to 124 deg; L knee ROM 2 to 134 deg    Time 3    Period Weeks    Status Achieved    Target Date 12/26/19      PT SHORT TERM GOAL #2   Title Pt will have improved R knee SLS to at least 30 sec for improved  stability    Baseline 11 sec R SLS, >1 min on L    Time 3    Period Weeks    Status Achieved    Target Date 12/26/19      PT SHORT TERM GOAL #3   Title Pt will be able to negotiate up/down stairs without discomfort or compensation    Baseline Unable    Time 3    Period Weeks    Status Achieved    Target Date 12/26/19             PT Long Term Goals - 01/15/20 1003      PT LONG TERM GOAL #1   Title Pt will be able to perform at least 5 min of running without knee discomfort    Baseline Unable    Time 6    Period Weeks    Status On-going      PT LONG TERM GOAL #2   Title Pt will be able to maintain R LE SLS for at least 1 min    Time 6    Period Weeks    Status Achieved      PT LONG TERM GOAL #3   Title Pt will be able to perform at least 5 jumps onto 4" high box    Baseline Unable    Time 6    Period Weeks    Status On-going      PT LONG TERM GOAL #4   Title Pt will have improved FOTO score to at least 23%    Baseline 31% FOTO    Time 6     Period Weeks    Status On-going                 Plan - 01/15/20 2297    Clinical Impression Statement Pt presents with increased R knee edema after performing her exercises yesterday. Treatment continued to focus on control with plyometrics. Increased knee and hip strengthening. Ice provided fo her edema. Noticeable R knee fatigue after exercises. Pt has met all short term goals and has only met LTG 2. Pt would benefit from continued PT to address her knee strength and control for return to running and jumping.    Personal Factors and Comorbidities Age;Comorbidity 1;Past/Current Experience;Time since onset of injury/illness/exacerbation    Comorbidities R knee injury x 3,    Examination-Activity Limitations Squat;Stairs;Locomotion Level;Other   Running   Examination-Participation Restrictions Yard Work;Community Activity    Stability/Clinical Decision Making Stable/Uncomplicated    Rehab Potential Good    PT Frequency 1x / week    PT Duration 6 weeks    PT Treatment/Interventions ADLs/Self Care Home Management;Biofeedback;Cryotherapy;Electrical Stimulation;Iontophoresis 4mg /ml Dexamethasone;Moist Heat;Traction;Ultrasound;Gait training;Stair training;Functional mobility training;Therapeutic activities;Therapeutic exercise;Balance training;Neuromuscular re-education;Patient/family education;Manual techniques;Passive range of motion;Dry needling;Taping;Vasopneumatic Device    PT Next Visit Plan Perform FOTO next visit. Progress knee proprioception, ROM, balance, and stability exercises. Continue strengthening and light plyometrics.    PT Home Exercise Plan Access Code [email or web address]    [email or web address] and Agree with Plan of Care Patient           Patient will benefit from skilled therapeutic intervention in order to improve the following deficits and impairments:  Decreased balance, Improper body mechanics, Decreased mobility, Decreased strength, Increased edema, Postural dysfunction, Impaired  sensation, Decreased coordination, Decreased activity tolerance, Decreased range of motion  Visit Diagnosis: Other abnormalities of gait and mobility - Plan: PT plan of care cert/re-cert  Localized edema - Plan: PT plan of care cert/re-cert  Stiffness of right knee, not elsewhere classified - Plan: PT plan of care cert/re-cert  Chronic pain of right knee - Plan: PT plan of care cert/re-cert     Problem List Patient Active Problem List   Diagnosis Date Noted  . Right knee pain 11/21/2019  . Cervical intraepithelial neoplasia grade III with severe dysplasia 09/25/2019  . Lichen sclerosus et atrophicus 03/02/2019  . Posterior vitreous detachment of left eye 01/10/2015  . Pre-syncope 07/19/2013    Cressie Betzler April Ma L Skyelynn Rambeau PT, DPT 01/15/2020, 10:16 AM  [facility] [address] [place], [place], [address] Phone: [phone]   Fax:  [phone]  Name: Jaclyn [name] MRN: [id number] Date of Birth: [birth year]

## 2020-01-15 NOTE — Addendum Note (Signed)
Addended by: Colbert Ewing MARIE L on: 01/15/2020 10:12 AM   Modules accepted: Orders

## 2020-01-19 ENCOUNTER — Other Ambulatory Visit: Payer: Self-pay

## 2020-01-19 ENCOUNTER — Ambulatory Visit: Payer: Self-pay

## 2020-01-19 ENCOUNTER — Ambulatory Visit: Payer: PPO | Admitting: Family Medicine

## 2020-01-19 ENCOUNTER — Encounter: Payer: Self-pay | Admitting: Family Medicine

## 2020-01-19 VITALS — BP 122/70 | HR 88 | Ht 67.0 in | Wt 142.0 lb

## 2020-01-19 DIAGNOSIS — M25561 Pain in right knee: Secondary | ICD-10-CM | POA: Diagnosis not present

## 2020-01-19 DIAGNOSIS — G8929 Other chronic pain: Secondary | ICD-10-CM | POA: Diagnosis not present

## 2020-01-19 NOTE — Progress Notes (Signed)
Rito Ehrlich, am serving as a Education administrator for Dr. [name].  Jaclyn [name] is a 69 y.o. female who presents to [facility] today for f/u of R knee pain that recurred after falling out of chair and landing on her ant knee.  She was last seen by Dr. Tamala Julian on 11/21/19 and was referred to PT of which she has completed 7 visits.  Since her last visit w/ Dr. Tamala Julian, pt reports she had another fall 3 days ago. Patient was cleaning her shower and when she backed up she tripped over the edge of the shower and fell back on her butt. Patient states that fortunately its still just the R knee that is in pain, does feel like the knee is stronger from her PT but is she is discouraged. Patient is inquiring wether a MRI would be a good next step.   Diagnostic imaging: R knee XR- 09/25/19  Pertinent review of systems: No fevers or chills  Relevant historical information: History of lichen sclerosis skin and vitreous detachment of the eye   Exam:  BP 122/70 (BP Location: Left Arm, Patient Position: Sitting, Cuff Size: Normal)   Pulse 88   Ht 5\' 7"  (1.702 m)   Wt 142 lb (64.4 kg)   SpO2 98%   BMI 22.24 kg/m  General: Well Developed, well nourished, and in no acute distress.   MSK: Right knee normal-appearing Normal motion with mild crepitation. Tender palpation lateral joint line. Stable ligamentous exam. Positive McMurray's test. Intact strength.    Lab and Radiology Results EXAM: RIGHT KNEE 3 VIEWS  COMPARISON:  None.  FINDINGS: Frontal, lateral, and sunrise patellar images were obtained. There is no fracture or dislocation. No joint effusion. There is slight generalized joint space narrowing. No erosive change. There is a small spur along the anterior superior patella.  IMPRESSION: Slight generalized joint space narrowing. No fracture, dislocation, or joint effusion. Spur along the anterior superior patella likely represents distal quadriceps  tendinosis.   Electronically Signed   By: Lowella Grip III M.D.   On: 09/25/2019 14:55 I, [name], personally (independently) visualized and performed the interpretation of the images attached in this note. Agree with radiology read.    Assessment and Plan: 69 y.o. female with right knee pain failing typical conservative management.  Pain is been ongoing since May.  At this point reasonable to proceed with either injection or MRI for further planning.  Plan to proceed with MRI and recheck after MRI.  May proceed with surgical planning or injection if needed.   PDMP not reviewed this encounter. Orders Placed This Encounter  Procedures  . MR Knee Right Wo Contrast    Standing Status:   Future    Standing Expiration Date:   01/18/2021    Order Specific Question:   What is the patient's sedation requirement?    Answer:   No Sedation    Order Specific Question:   Does the patient have a pacemaker or implanted devices?    Answer:   No    Order Specific Question:   Preferred imaging location?    Answer:   Product/process development scientist (table limit-350lbs)    Order Specific Question:   Radiology Contrast Protocol - do NOT remove file path    Answer:   [email or web address]   No orders of the defined types were placed in this encounter.    Discussed warning signs or symptoms. Please see discharge instructions. Patient expresses understanding.   The  above documentation has been reviewed and is accurate and complete [name], M.D.

## 2020-01-19 NOTE — Patient Instructions (Addendum)
Thank you for coming in today. Plan for MRI.  Recheck after MRI likely unless there is a clear surgical problem.   If you do not hear about MRI scheduling by end of next week let me know.

## 2020-01-22 ENCOUNTER — Other Ambulatory Visit: Payer: Self-pay

## 2020-01-22 ENCOUNTER — Ambulatory Visit (INDEPENDENT_AMBULATORY_CARE_PROVIDER_SITE_OTHER): Payer: PPO

## 2020-01-22 DIAGNOSIS — M25561 Pain in right knee: Secondary | ICD-10-CM | POA: Diagnosis not present

## 2020-01-22 DIAGNOSIS — S83241A Other tear of medial meniscus, current injury, right knee, initial encounter: Secondary | ICD-10-CM | POA: Diagnosis not present

## 2020-01-22 DIAGNOSIS — G8929 Other chronic pain: Secondary | ICD-10-CM | POA: Diagnosis not present

## 2020-01-22 DIAGNOSIS — M25461 Effusion, right knee: Secondary | ICD-10-CM | POA: Diagnosis not present

## 2020-01-22 DIAGNOSIS — M7121 Synovial cyst of popliteal space [Baker], right knee: Secondary | ICD-10-CM | POA: Diagnosis not present

## 2020-01-22 DIAGNOSIS — M1711 Unilateral primary osteoarthritis, right knee: Secondary | ICD-10-CM | POA: Diagnosis not present

## 2020-01-23 NOTE — Progress Notes (Signed)
Knee MRI shows a large tear of the medial meniscus.  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  No ligament tear visible.  Either return to clinic to discuss the MRI in further detail and probably proceed to injection or let me know and I will refer to orthopedic surgery for surgical consultation.

## 2020-01-24 DIAGNOSIS — E118 Type 2 diabetes mellitus with unspecified complications: Secondary | ICD-10-CM | POA: Diagnosis not present

## 2020-01-24 DIAGNOSIS — Z794 Long term (current) use of insulin: Secondary | ICD-10-CM | POA: Diagnosis not present

## 2020-01-24 DIAGNOSIS — E1165 Type 2 diabetes mellitus with hyperglycemia: Secondary | ICD-10-CM | POA: Diagnosis not present

## 2020-01-25 ENCOUNTER — Telehealth: Payer: Self-pay | Admitting: Family Medicine

## 2020-01-25 DIAGNOSIS — S83241A Other tear of medial meniscus, current injury, right knee, initial encounter: Secondary | ICD-10-CM

## 2020-01-25 NOTE — Telephone Encounter (Signed)
Patient called in response to the voicemail she received about her MRI results. She said that she is going to continue PT but would like a referral sent to Dr Wynelle Link at [facility].

## 2020-01-25 NOTE — Telephone Encounter (Signed)
Please refer pt to Dr. Maureen Ralphs at [facility] re R knee meniscal tear.

## 2020-01-26 ENCOUNTER — Ambulatory Visit: Payer: PPO | Attending: Family Medicine | Admitting: Physical Therapy

## 2020-01-26 ENCOUNTER — Encounter: Payer: Self-pay | Admitting: Physical Therapy

## 2020-01-26 ENCOUNTER — Other Ambulatory Visit: Payer: Self-pay

## 2020-01-26 DIAGNOSIS — G8929 Other chronic pain: Secondary | ICD-10-CM | POA: Diagnosis not present

## 2020-01-26 DIAGNOSIS — R2689 Other abnormalities of gait and mobility: Secondary | ICD-10-CM | POA: Diagnosis not present

## 2020-01-26 DIAGNOSIS — R600 Localized edema: Secondary | ICD-10-CM | POA: Diagnosis not present

## 2020-01-26 DIAGNOSIS — M25661 Stiffness of right knee, not elsewhere classified: Secondary | ICD-10-CM

## 2020-01-26 DIAGNOSIS — M25561 Pain in right knee: Secondary | ICD-10-CM | POA: Diagnosis not present

## 2020-01-26 NOTE — Therapy (Addendum)
[place], [place], [address] Phone: [phone]   Fax:  [phone]  Physical Therapy Treatment  Patient Details  Name: Jaclyn [name] MRN: [id number] Date of Birth: 19-Dec-1950 Referring Provider (PT): Lyndal Pulley, DO   Encounter Date: 01/26/2020   PT End of Session - 01/26/20 1135    Visit Number 8    Number of Visits 12    Date for PT Re-Evaluation 02/26/20    PT Start Time 1050    PT Stop Time 1135    PT Time Calculation (min) 45 min    Activity Tolerance Patient tolerated treatment well    Behavior During Therapy [facility] for tasks assessed/performed           Past Medical History:  Diagnosis Date  . Disorder of bone and cartilage, unspecified   . Insomnia, unspecified   . MVP (mitral valve prolapse)    SBE prophylaxis  . Postmenopausal atrophic vaginitis   . Psoriasis   . Pure hypercholesterolemia   . Recurrent genital herpes     Past Surgical History:  Procedure Laterality Date  . EXCISION NEUROMA Right 2002   foot  . SAPHENOUS VEIN GRAFT RESECTION Left 1988  . TONSILLECTOMY  1959    There were no vitals filed for this visit.   Subjective Assessment - 01/26/20 1116    Subjective Pt reports she had a fall a few days ago resulting in increased pain x2 days. Pt followed up with ortho and MRI was performed. MRI found pt to have large lateral meniscus tear. Pt notes that despite large tear, her knee is feeling better in general and getting stronger. Pt reports she's been able to go on long 1 hour walks without any issues. Pt has been continuing her PT exercises in addition to her normal exercise routine with less pain. She notes that stairs have also been less painful.    Pertinent History R knee injury in undergrad while dancing, R knee injury on Thanksgiving 2020 and June 2021    Limitations Walking    How long can you walk comfortably? Intermittent running and walking is not comfortable;  unable to run but able to walk ~1 hr    Diagnostic tests MRI 9/6: 1. Large radial tear involving the root of the posterior horn of themedial meniscus.2. Diffusely degenerated lateral meniscus without focal tear.3. Underlying mild tricompartmental degenerative changes. No acuteosseous findings.4. Intact cruciate and collateral ligaments.    Patient Stated Goals Improve knee stability    Currently in Pain? No/denies                             [facility] Adult PT Treatment/Exercise - 01/26/20 0001      Knee/Hip Exercises: Stretches   Active Hamstring Stretch Both;30 seconds;5 reps    Active Hamstring Stretch Limitations performed in between plyometric sets    Quad Stretch Both;30 seconds;5 reps    Quad Stretch Limitations performed in between plyometric sets    Gastroc Stretch Both;30 seconds;5 reps    Gastroc Stretch Limitations performed in between plyometric sets      Knee/Hip Exercises: Aerobic   Recumbent Bike L2 x 5 min      Knee/Hip Exercises: Plyometrics   Other Plyometric Exercises DL hops in place, DL hop forward/backward; DL hop side to side 1x30 each; SL heel raise bilat x20; SL skip forward/backward x20, SL hop side to side x20, DL broad jump x5  PT Education - 01/26/20 1151    Education Details Discussed normal and abnormal response to initiating return to running program.    Person(s) Educated Patient    Methods Explanation;Demonstration;Tactile cues;Verbal cues    Comprehension Verbalized understanding;Returned demonstration;Verbal cues required;Tactile cues required            PT Short Term Goals - 12/27/19 1222      PT SHORT TERM GOAL #1   Title Pt's Rt knee ROM will be equal to her Lt knee to demonstrate improved edema    Baseline R knee ROM 7 to 124 deg; L knee ROM 2 to 134 deg    Time 3    Period Weeks    Status Achieved    Target Date 12/26/19      PT SHORT TERM GOAL #2   Title Pt will have improved R knee SLS to  at least 30 sec for improved stability    Baseline 11 sec R SLS, >1 min on L    Time 3    Period Weeks    Status Achieved    Target Date 12/26/19      PT SHORT TERM GOAL #3   Title Pt will be able to negotiate up/down stairs without discomfort or compensation    Baseline Unable    Time 3    Period Weeks    Status Achieved    Target Date 12/26/19             PT Long Term Goals - 01/15/20 1003      PT LONG TERM GOAL #1   Title Pt will be able to perform at least 5 min of running without knee discomfort    Baseline Unable    Time 6    Period Weeks    Status On-going      PT LONG TERM GOAL #2   Title Pt will be able to maintain R LE SLS for at least 1 min    Time 6    Period Weeks    Status Achieved      PT LONG TERM GOAL #3   Title Pt will be able to perform at least 5 jumps onto 4" high box    Baseline Unable    Time 6    Period Weeks    Status On-going      PT LONG TERM GOAL #4   Title Pt will have improved FOTO score to at least 23%    Baseline 31% FOTO    Time 6    Period Weeks    Status On-going                 Plan - 01/26/20 1136    Clinical Impression Statement Pt's knee continues to progress with improving strength, stability, and proprioception. Treatment focused on initiating and modifying phase II plyometrics in [place] [facility] Women's running protocol. Discussed what pain is okay and not okay; when to decrease activity and when to progress. Pt tolerated treatment well -- no increased pain noted with exercises.    Personal Factors and Comorbidities Age;Comorbidity 1;Past/Current Experience;Time since onset of injury/illness/exacerbation    Comorbidities R knee injury x 3,    Examination-Activity Limitations Squat;Stairs;Locomotion Level;Other   Running   Examination-Participation Restrictions Yard Work;Community Activity    Stability/Clinical Decision Making Stable/Uncomplicated    Rehab Potential Good    PT Frequency 1x / week    PT Duration 6  weeks    PT Treatment/Interventions ADLs/Self Care Home Management;Biofeedback;Cryotherapy;Dealer  Stimulation;Iontophoresis 4mg /ml Dexamethasone;Moist Heat;Traction;Ultrasound;Gait training;Stair training;Functional mobility training;Therapeutic activities;Therapeutic exercise;Balance training;Neuromuscular re-education;Patient/family education;Manual techniques;Passive range of motion;Dry needling;Taping;Vasopneumatic Device    PT Next Visit Plan Perform FOTO next visit. Progress knee proprioception, ROM, balance, and stability exercises. Continue strengthening and light plyometrics. Assess response to [place] and Women's return to running protocol.    PT Home Exercise Plan Access Code [email or web address]    [email or web address] and Agree with Plan of Care Patient           Patient will benefit from skilled therapeutic intervention in order to improve the following deficits and impairments:  Decreased balance, Improper body mechanics, Decreased mobility, Decreased strength, Increased edema, Postural dysfunction, Impaired sensation, Decreased coordination, Decreased activity tolerance, Decreased range of motion  Visit Diagnosis: Other abnormalities of gait and mobility  Localized edema  Stiffness of right knee, not elsewhere classified  Chronic pain of right knee     Problem List Patient Active Problem List   Diagnosis Date Noted  . Right knee pain 11/21/2019  . Cervical intraepithelial neoplasia grade III with severe dysplasia 09/25/2019  . Lichen sclerosus et atrophicus 03/02/2019  . Posterior vitreous detachment of left eye 01/10/2015  . Pre-syncope 07/19/2013    Donne Baley April Ma L Sadaf Przybysz PT, DPT 01/26/2020, 11:52 AM  [facility] [address] [place], [place], [address] Phone: [phone]   Fax:  [phone]  Name: Jaclyn [name] MRN: [id number] Date of Birth: 01/11/1951

## 2020-01-26 NOTE — Telephone Encounter (Signed)
Referral to Dr. Wynelle Link ordered.

## 2020-01-26 NOTE — Telephone Encounter (Signed)
Sent through proficient

## 2020-01-29 ENCOUNTER — Ambulatory Visit: Payer: PPO | Admitting: Physical Therapy

## 2020-02-05 ENCOUNTER — Encounter: Payer: PPO | Admitting: Physical Therapy

## 2020-02-07 DIAGNOSIS — Z23 Encounter for immunization: Secondary | ICD-10-CM | POA: Diagnosis not present

## 2020-02-09 ENCOUNTER — Encounter: Payer: Self-pay | Admitting: Physical Therapy

## 2020-02-09 ENCOUNTER — Ambulatory Visit: Payer: PPO | Admitting: Physical Therapy

## 2020-02-09 ENCOUNTER — Other Ambulatory Visit: Payer: Self-pay

## 2020-02-09 DIAGNOSIS — R2689 Other abnormalities of gait and mobility: Secondary | ICD-10-CM | POA: Diagnosis not present

## 2020-02-09 DIAGNOSIS — M25661 Stiffness of right knee, not elsewhere classified: Secondary | ICD-10-CM

## 2020-02-09 DIAGNOSIS — G8929 Other chronic pain: Secondary | ICD-10-CM

## 2020-02-09 DIAGNOSIS — R600 Localized edema: Secondary | ICD-10-CM

## 2020-02-09 NOTE — Therapy (Signed)
[place], [place], [address] Phone: [phone]   Fax:  [phone]  Physical Therapy Treatment and Discharge  Patient Details  Name: Jaclyn [name] MRN: [id number] Date of Birth: [birth year] Referring Provider (PT): Lyndal Pulley, DO   Encounter Date: 02/09/2020   PT End of Session - 02/09/20 0901    Visit Number 9    Number of Visits 12    Date for PT Re-Evaluation 02/26/20    PT Start Time 0835    PT Stop Time 0915    PT Time Calculation (min) 40 min    Activity Tolerance Patient tolerated treatment well    Behavior During Therapy [facility] for tasks assessed/performed           Past Medical History:  Diagnosis Date  . Disorder of bone and cartilage, unspecified   . Insomnia, unspecified   . MVP (mitral valve prolapse)    SBE prophylaxis  . Postmenopausal atrophic vaginitis   . Psoriasis   . Pure hypercholesterolemia   . Recurrent genital herpes     Past Surgical History:  Procedure Laterality Date  . EXCISION NEUROMA Right 2002   foot  . SAPHENOUS VEIN GRAFT RESECTION Left 1988  . TONSILLECTOMY  1959    There were no vitals filed for this visit.   Subjective Assessment - 02/09/20 0841    Subjective Pt states that she's initiated running. Pt states she's up to running 4 min intervals 3 times. Pt has been swelling at night but swelling goes away next day.    Pertinent History R knee injury in undergrad while dancing, R knee injury on Thanksgiving 2020 and June 2021    Limitations Walking    How long can you walk comfortably? Intermittent running and walking is not comfortable; unable to run but able to walk ~1 hr    Diagnostic tests MRI 9/6: 1. Large radial tear involving the root of the posterior horn of themedial meniscus.2. Diffusely degenerated lateral meniscus without focal tear.3. Underlying mild tricompartmental degenerative changes. No acuteosseous findings.4. Intact cruciate and  collateral ligaments.    Patient Stated Goals Improve knee stability    Currently in Pain? No/denies                             [facility] Adult PT Treatment/Exercise - 02/09/20 0001      Knee/Hip Exercises: Machines for Strengthening   Cybex Knee Flexion DL: 30# x10, 35# 2x8    Total Gym Leg Press DL 95# x10, SL: 45# x10 bilat, 55# x10 bilat, 65# x10 on L, 60# x8 on $       Knee/Hip Exercises: Plyometrics   Other Plyometric Exercises Box jumps 2x10 on 4" box      Modalities   Modalities Vasopneumatic      Vasopneumatic   Number Minutes Vasopneumatic  10 minutes    Vasopnuematic Location  Knee    Vasopneumatic Pressure Low    Vasopneumatic Temperature  34                    PT Short Term Goals - 02/09/20 0909      PT SHORT TERM GOAL #1   Title Pt's Rt knee ROM will be equal to her Lt knee to demonstrate improved edema    Baseline R knee ROM 7 to 124 deg; L knee ROM 2 to 134 deg    Time 3  Period Weeks    Status Achieved    Target Date 12/26/19      PT SHORT TERM GOAL #2   Title Pt will have improved R knee SLS to at least 30 sec for improved stability    Baseline 11 sec R SLS, >1 min on L    Time 3    Period Weeks    Status Achieved    Target Date 12/26/19      PT SHORT TERM GOAL #3   Title Pt will be able to negotiate up/down stairs without discomfort or compensation    Baseline Unable    Time 3    Period Weeks    Status Achieved    Target Date 12/26/19             PT Long Term Goals - 02/09/20 0910      PT LONG TERM GOAL #1   Title Pt will be able to perform at least 5 min of running without knee discomfort    Baseline Unable    Time 6    Period Weeks    Status Achieved      PT LONG TERM GOAL #2   Title Pt will be able to maintain R LE SLS for at least 1 min    Time 6    Period Weeks    Status Achieved      PT LONG TERM GOAL #3   Title Pt will be able to perform at least 5 jumps onto 4" high box    Baseline Unable      Time 6    Period Weeks    Status Achieved      PT LONG TERM GOAL #4   Title Pt will have improved FOTO score to at least 23%    Baseline 31% FOTO    Time 6    Period Weeks    Status Achieved                 Plan - 02/09/20 0901    Clinical Impression Statement R LE strength is becoming more comparable to L LE. In general, pt has been able to initiate running with minimal pain and just some edema. Pt has education on how to self progress her running protocol. Pt able to hop on single leg, jump without issue, with improved FOTO score to 14% and has met all of her therapy goals at this time. Pt is ready to d/c from therapy.    Personal Factors and Comorbidities Age;Comorbidity 1;Past/Current Experience;Time since onset of injury/illness/exacerbation    Comorbidities R knee injury x 3,    Examination-Activity Limitations Squat;Stairs;Locomotion Level;Other   Running   Examination-Participation Restrictions Yard Work;Community Activity    Stability/Clinical Decision Making Stable/Uncomplicated    Rehab Potential Good    PT Frequency 1x / week    PT Duration 6 weeks    PT Treatment/Interventions ADLs/Self Care Home Management;Biofeedback;Cryotherapy;Electrical Stimulation;Iontophoresis 37m/ml Dexamethasone;Moist Heat;Traction;Ultrasound;Gait training;Stair training;Functional mobility training;Therapeutic activities;Therapeutic exercise;Balance training;Neuromuscular re-education;Patient/family education;Manual techniques;Passive range of motion;Dry needling;Taping;Vasopneumatic Device    PT Next Visit Plan Progress knee proprioception, ROM, balance, and stability exercises. Continue strengthening and light plyometrics. Assess response to [place] [facility] return to running protocol.    PT Home Exercise Plan Access Code [email or web address]    [email or web address] Agree with Plan of Care Patient           PHYSICAL THERAPY DISCHARGE SUMMARY  Visits from Start of Care: 9  Current functional  level related to goals /  functional outcomes: See above   Remaining deficits: Some mild difficulty with single leg hopping; some mild R LE weakness vs L LE.   Education / Equipment: Discussed return to running protocol and safe progression. Discussed continued R LE strengthening.  Plan: Patient agrees to discharge.  Patient goals were met. Patient is being discharged due to meeting the stated rehab goals.  ?????       Patient will benefit from skilled therapeutic intervention in order to improve the following deficits and impairments:  Decreased balance, Improper body mechanics, Decreased mobility, Decreased strength, Increased edema, Postural dysfunction, Impaired sensation, Decreased coordination, Decreased activity tolerance, Decreased range of motion  Visit Diagnosis: Other abnormalities of gait and mobility  Localized edema  Stiffness of right knee, not elsewhere classified  Chronic pain of right knee     Problem List Patient Active Problem List   Diagnosis Date Noted  . Right knee pain 11/21/2019  . Cervical intraepithelial neoplasia grade III with severe dysplasia 09/25/2019  . Lichen sclerosus et atrophicus 03/02/2019  . Posterior vitreous detachment of left eye 01/10/2015  . Pre-syncope 07/19/2013    Yumna Ebers April Ma L Krista Godsil PT, DPT 02/09/2020, 9:12 AM  [facility] [address] [place], [place], [address] Phone: [phone]   Fax:  [phone]  Name: Ragan Duhon MRN: [id number] Date of Birth: 1951/02/14

## 2020-02-19 ENCOUNTER — Ambulatory Visit: Payer: PPO | Admitting: Family Medicine

## 2020-02-19 ENCOUNTER — Encounter: Payer: PPO | Admitting: Physical Therapy

## 2020-02-26 ENCOUNTER — Encounter: Payer: PPO | Admitting: Physical Therapy

## 2020-03-07 ENCOUNTER — Ambulatory Visit: Payer: PPO | Admitting: Dermatology

## 2020-03-07 ENCOUNTER — Encounter: Payer: Self-pay | Admitting: Dermatology

## 2020-03-07 ENCOUNTER — Other Ambulatory Visit: Payer: Self-pay

## 2020-03-07 DIAGNOSIS — Z1283 Encounter for screening for malignant neoplasm of skin: Secondary | ICD-10-CM | POA: Diagnosis not present

## 2020-03-07 DIAGNOSIS — L309 Dermatitis, unspecified: Secondary | ICD-10-CM

## 2020-03-07 MED ORDER — BETAMETHASONE DIPROPIONATE 0.05 % EX CREA: TOPICAL_CREAM | Freq: Two times a day (BID) | CUTANEOUS | 0 refills | Status: DC

## 2020-03-07 NOTE — Progress Notes (Signed)
Ear draining, palm of hands eczema? tx for both aug betamenthasone cream

## 2020-03-21 ENCOUNTER — Other Ambulatory Visit: Payer: Self-pay

## 2020-03-21 ENCOUNTER — Ambulatory Visit
Admission: RE | Admit: 2020-03-21 | Discharge: 2020-03-21 | Disposition: A | Discharge status: Home / Self Care | Payer: PPO | Source: Ambulatory Visit | Attending: Family Medicine | Admitting: Family Medicine

## 2020-03-21 DIAGNOSIS — E2839 Other primary ovarian failure: Secondary | ICD-10-CM

## 2020-03-21 DIAGNOSIS — R7309 Other abnormal glucose: Secondary | ICD-10-CM | POA: Diagnosis not present

## 2020-03-21 DIAGNOSIS — Z780 Asymptomatic menopausal state: Secondary | ICD-10-CM | POA: Diagnosis not present

## 2020-03-25 DIAGNOSIS — I8311 Varicose veins of right lower extremity with inflammation: Secondary | ICD-10-CM | POA: Diagnosis not present

## 2020-03-25 DIAGNOSIS — I8312 Varicose veins of left lower extremity with inflammation: Secondary | ICD-10-CM | POA: Diagnosis not present

## 2020-03-25 DIAGNOSIS — I83813 Varicose veins of bilateral lower extremities with pain: Secondary | ICD-10-CM | POA: Diagnosis not present

## 2020-03-28 DIAGNOSIS — R7309 Other abnormal glucose: Secondary | ICD-10-CM | POA: Diagnosis not present

## 2020-03-28 DIAGNOSIS — Z6822 Body mass index (BMI) 22.0-22.9, adult: Secondary | ICD-10-CM | POA: Diagnosis not present

## 2020-03-28 DIAGNOSIS — E1165 Type 2 diabetes mellitus with hyperglycemia: Secondary | ICD-10-CM | POA: Diagnosis not present

## 2020-03-28 DIAGNOSIS — Z794 Long term (current) use of insulin: Secondary | ICD-10-CM | POA: Diagnosis not present

## 2020-03-29 DIAGNOSIS — S83241A Other tear of medial meniscus, current injury, right knee, initial encounter: Secondary | ICD-10-CM | POA: Diagnosis not present

## 2020-03-29 DIAGNOSIS — M25561 Pain in right knee: Secondary | ICD-10-CM | POA: Diagnosis not present

## 2020-04-04 DIAGNOSIS — Z01419 Encounter for gynecological examination (general) (routine) without abnormal findings: Secondary | ICD-10-CM | POA: Diagnosis not present

## 2020-04-04 DIAGNOSIS — Z1231 Encounter for screening mammogram for malignant neoplasm of breast: Secondary | ICD-10-CM | POA: Diagnosis not present

## 2020-04-04 DIAGNOSIS — R87612 Low grade squamous intraepithelial lesion on cytologic smear of cervix (LGSIL): Secondary | ICD-10-CM | POA: Diagnosis not present

## 2020-06-13 DIAGNOSIS — I8311 Varicose veins of right lower extremity with inflammation: Secondary | ICD-10-CM | POA: Diagnosis not present

## 2020-06-13 DIAGNOSIS — I83811 Varicose veins of right lower extremities with pain: Secondary | ICD-10-CM | POA: Diagnosis not present

## 2020-06-13 DIAGNOSIS — I83891 Varicose veins of right lower extremities with other complications: Secondary | ICD-10-CM | POA: Diagnosis not present

## 2020-07-31 DIAGNOSIS — I8312 Varicose veins of left lower extremity with inflammation: Secondary | ICD-10-CM | POA: Diagnosis not present

## 2020-07-31 DIAGNOSIS — I83892 Varicose veins of left lower extremities with other complications: Secondary | ICD-10-CM | POA: Diagnosis not present

## 2020-08-02 DIAGNOSIS — I8311 Varicose veins of right lower extremity with inflammation: Secondary | ICD-10-CM | POA: Diagnosis not present

## 2020-08-13 DIAGNOSIS — I83811 Varicose veins of right lower extremities with pain: Secondary | ICD-10-CM | POA: Diagnosis not present

## 2020-08-13 DIAGNOSIS — I8311 Varicose veins of right lower extremity with inflammation: Secondary | ICD-10-CM | POA: Diagnosis not present

## 2020-08-16 DIAGNOSIS — I8311 Varicose veins of right lower extremity with inflammation: Secondary | ICD-10-CM | POA: Diagnosis not present

## 2020-08-21 DIAGNOSIS — I8311 Varicose veins of right lower extremity with inflammation: Secondary | ICD-10-CM | POA: Diagnosis not present

## 2020-08-21 DIAGNOSIS — I83811 Varicose veins of right lower extremities with pain: Secondary | ICD-10-CM | POA: Diagnosis not present

## 2020-09-04 DIAGNOSIS — I83891 Varicose veins of right lower extremities with other complications: Secondary | ICD-10-CM | POA: Diagnosis not present

## 2020-09-04 DIAGNOSIS — I8311 Varicose veins of right lower extremity with inflammation: Secondary | ICD-10-CM | POA: Diagnosis not present

## 2020-09-18 DIAGNOSIS — I8311 Varicose veins of right lower extremity with inflammation: Secondary | ICD-10-CM | POA: Diagnosis not present

## 2020-09-18 DIAGNOSIS — I83811 Varicose veins of right lower extremities with pain: Secondary | ICD-10-CM | POA: Diagnosis not present

## 2020-09-18 DIAGNOSIS — I83891 Varicose veins of right lower extremities with other complications: Secondary | ICD-10-CM | POA: Diagnosis not present

## 2020-09-18 DIAGNOSIS — M7981 Nontraumatic hematoma of soft tissue: Secondary | ICD-10-CM | POA: Diagnosis not present

## 2020-09-25 DIAGNOSIS — D1722 Benign lipomatous neoplasm of skin and subcutaneous tissue of left arm: Secondary | ICD-10-CM | POA: Diagnosis not present

## 2020-10-23 DIAGNOSIS — U071 COVID-19: Secondary | ICD-10-CM | POA: Diagnosis not present

## 2020-10-23 DIAGNOSIS — R059 Cough, unspecified: Secondary | ICD-10-CM | POA: Diagnosis not present

## 2020-10-23 DIAGNOSIS — R0981 Nasal congestion: Secondary | ICD-10-CM | POA: Diagnosis not present

## 2020-11-25 ENCOUNTER — Other Ambulatory Visit: Payer: Self-pay | Admitting: Dermatology

## 2020-11-25 DIAGNOSIS — L309 Dermatitis, unspecified: Secondary | ICD-10-CM

## 2020-12-13 DIAGNOSIS — R87612 Low grade squamous intraepithelial lesion on cytologic smear of cervix (LGSIL): Secondary | ICD-10-CM | POA: Diagnosis not present

## 2020-12-15 ENCOUNTER — Encounter: Payer: Self-pay | Admitting: Dermatology

## 2020-12-15 NOTE — Progress Notes (Signed)
   Follow-Up Visit   Subjective  Jaclyn [name] is a 70 y.o. female who presents for the following: Annual Exam (Patient here today for skin check. No concerns).  Skin check plus check rash on ear and hand Location:  Duration:  Quality:  Associated Signs/Symptoms: Modifying Factors:  Severity:  Timing: Context:   Objective  Well appearing patient in no apparent distress; mood and affect are within normal limits. Molar is not covered by undergarments examined; no atypical pigmented lesions or nonmelanoma skin cancer.  Left Hand - Anterior, Left Inferior Crus of Antihelix Patchy chronic dermatitis hand and  distal ear canal/bowl, etiology uncertain    A full examination was performed including scalp, head, eyes, ears, nose, lips, neck, chest, axillae, abdomen, back, buttocks, bilateral upper extremities, bilateral lower extremities, hands, feet, fingers, toes, fingernails, and toenails. All findings within normal limits unless otherwise noted below.  Areas beneath undergarments not examined   Assessment & Plan    Eczema, unspecified type Left Hand - Anterior; Left Inferior Crus of Antihelix  Use topical betamethasone daily after bathing for 3 weeks and then call for follow-up status report.  betamethasone dipropionate 0.05 % cream - Left Hand - Anterior Apply topically 2 (two) times daily.  Encounter for screening for malignant neoplasm of skin  Annual skin check, patient encouraged to self examine twice annually      I, Lavonna Monarch, MD, have reviewed all documentation for this visit.  The documentation on 12/15/20 for the exam, diagnosis, procedures, and orders are all accurate and complete.

## 2021-01-03 DIAGNOSIS — E7800 Pure hypercholesterolemia, unspecified: Secondary | ICD-10-CM | POA: Diagnosis not present

## 2021-01-03 DIAGNOSIS — Z0000 Encounter for general adult medical examination without abnormal findings: Secondary | ICD-10-CM | POA: Diagnosis not present

## 2021-01-03 DIAGNOSIS — Z1389 Encounter for screening for other disorder: Secondary | ICD-10-CM | POA: Diagnosis not present

## 2021-01-03 DIAGNOSIS — M85851 Other specified disorders of bone density and structure, right thigh: Secondary | ICD-10-CM | POA: Diagnosis not present

## 2021-01-03 DIAGNOSIS — R7303 Prediabetes: Secondary | ICD-10-CM | POA: Diagnosis not present

## 2021-01-09 DIAGNOSIS — D1720 Benign lipomatous neoplasm of skin and subcutaneous tissue of unspecified limb: Secondary | ICD-10-CM | POA: Diagnosis not present

## 2021-02-17 DIAGNOSIS — H43812 Vitreous degeneration, left eye: Secondary | ICD-10-CM | POA: Diagnosis not present

## 2021-03-17 ENCOUNTER — Encounter: Payer: Self-pay | Admitting: Physician Assistant

## 2021-03-17 ENCOUNTER — Other Ambulatory Visit: Payer: Self-pay

## 2021-03-17 ENCOUNTER — Ambulatory Visit: Payer: PPO | Admitting: Physician Assistant

## 2021-03-17 DIAGNOSIS — Z1283 Encounter for screening for malignant neoplasm of skin: Secondary | ICD-10-CM

## 2021-03-17 DIAGNOSIS — L570 Actinic keratosis: Secondary | ICD-10-CM

## 2021-03-17 DIAGNOSIS — Z808 Family history of malignant neoplasm of other organs or systems: Secondary | ICD-10-CM

## 2021-03-17 MED ORDER — TOLAK 4 % EX CREA: TOPICAL_CREAM | CUTANEOUS | 1 refills | Status: DC

## 2021-03-17 NOTE — Patient Instructions (Signed)
Over the counter- 5% Minoxidil

## 2021-03-19 ENCOUNTER — Ambulatory Visit: Payer: PPO | Admitting: Physician Assistant

## 2021-03-19 ENCOUNTER — Encounter: Payer: Self-pay | Admitting: Physician Assistant

## 2021-03-19 NOTE — Progress Notes (Signed)
   Follow-Up Visit   Subjective  Jaclyn [name] is a 69 y.o. female who presents for the following: Annual Exam (No new concerns- family history of non mole skin cancer but no personal history of melanoma or non mole skin cancer.).   The following portions of the chart were reviewed this encounter and updated as appropriate:  Tobacco  Allergies  Meds  Problems  Med Hx  Surg Hx  Fam Hx      Objective  Well appearing patient in no apparent distress; mood and affect are within normal limits.  A full examination was performed including scalp, head, eyes, ears, nose, lips, neck, chest, axillae, abdomen, back, buttocks, bilateral upper extremities, bilateral lower extremities, hands, feet, fingers, toes, fingernails, and toenails. All findings within normal limits unless otherwise noted below.  Left Parotid Area, Right Nasal Sidewall Erythematous patches with gritty scale.  Assessment & Plan  AK (actinic keratosis) (2) Right Nasal Sidewall; Left Parotid Area  Destruction of lesion - Left Parotid Area, Right Nasal Sidewall Complexity: simple   Destruction method: cryotherapy   Informed consent: discussed and consent obtained   Timeout:  patient name, date of birth, surgical site, and procedure verified Lesion destroyed using liquid nitrogen: Yes   Cryotherapy cycles:  3 Outcome: patient tolerated procedure well with no complications    Fluorouracil (TOLAK) 4 % CREA - Left Parotid Area, Right Nasal Sidewall Apply to affected area qhs Monday- Sunday x 2 weeks- expect irritation and avoid sunlight   I, Kamarii Carton, PA-C, have reviewed all documentation's for this visit.  The documentation on 03/19/21 for the exam, diagnosis, procedures and orders are all accurate and complete.

## 2021-04-07 DIAGNOSIS — Z1231 Encounter for screening mammogram for malignant neoplasm of breast: Secondary | ICD-10-CM | POA: Diagnosis not present

## 2021-04-28 DIAGNOSIS — I872 Venous insufficiency (chronic) (peripheral): Secondary | ICD-10-CM | POA: Diagnosis not present

## 2021-04-28 DIAGNOSIS — I83812 Varicose veins of left lower extremities with pain: Secondary | ICD-10-CM | POA: Diagnosis not present

## 2021-04-28 DIAGNOSIS — I87392 Chronic venous hypertension (idiopathic) with other complications of left lower extremity: Secondary | ICD-10-CM | POA: Diagnosis not present

## 2021-04-28 DIAGNOSIS — M7989 Other specified soft tissue disorders: Secondary | ICD-10-CM | POA: Diagnosis not present

## 2021-06-05 DIAGNOSIS — I83892 Varicose veins of left lower extremities with other complications: Secondary | ICD-10-CM | POA: Diagnosis not present

## 2021-06-09 DIAGNOSIS — M79605 Pain in left leg: Secondary | ICD-10-CM | POA: Diagnosis not present

## 2021-06-09 DIAGNOSIS — Z09 Encounter for follow-up examination after completed treatment for conditions other than malignant neoplasm: Secondary | ICD-10-CM | POA: Diagnosis not present

## 2021-06-18 DIAGNOSIS — Z124 Encounter for screening for malignant neoplasm of cervix: Secondary | ICD-10-CM | POA: Diagnosis not present

## 2021-06-18 DIAGNOSIS — Z01419 Encounter for gynecological examination (general) (routine) without abnormal findings: Secondary | ICD-10-CM | POA: Diagnosis not present

## 2021-06-19 DIAGNOSIS — I83892 Varicose veins of left lower extremities with other complications: Secondary | ICD-10-CM | POA: Diagnosis not present

## 2021-06-19 DIAGNOSIS — M7989 Other specified soft tissue disorders: Secondary | ICD-10-CM | POA: Diagnosis not present

## 2021-07-03 DIAGNOSIS — M7989 Other specified soft tissue disorders: Secondary | ICD-10-CM | POA: Diagnosis not present

## 2021-07-03 DIAGNOSIS — I83812 Varicose veins of left lower extremities with pain: Secondary | ICD-10-CM | POA: Diagnosis not present

## 2021-07-03 DIAGNOSIS — I83892 Varicose veins of left lower extremities with other complications: Secondary | ICD-10-CM | POA: Diagnosis not present

## 2021-07-09 ENCOUNTER — Telehealth: Payer: PPO | Admitting: Physician Assistant

## 2021-07-09 NOTE — Progress Notes (Signed)
Patient no showed for visit despite provider waiting in video room for 10 minutes after appointment time and links sent.   Leeanne Rio, PA-C

## 2021-07-30 DIAGNOSIS — I87392 Chronic venous hypertension (idiopathic) with other complications of left lower extremity: Secondary | ICD-10-CM | POA: Diagnosis not present

## 2021-07-30 DIAGNOSIS — I83892 Varicose veins of left lower extremities with other complications: Secondary | ICD-10-CM | POA: Diagnosis not present

## 2021-11-10 DIAGNOSIS — I83813 Varicose veins of bilateral lower extremities with pain: Secondary | ICD-10-CM | POA: Diagnosis not present

## 2021-11-10 DIAGNOSIS — M7989 Other specified soft tissue disorders: Secondary | ICD-10-CM | POA: Diagnosis not present

## 2021-11-24 DIAGNOSIS — I872 Venous insufficiency (chronic) (peripheral): Secondary | ICD-10-CM | POA: Diagnosis not present

## 2021-11-24 DIAGNOSIS — R600 Localized edema: Secondary | ICD-10-CM | POA: Diagnosis not present

## 2021-11-26 DIAGNOSIS — M545 Low back pain, unspecified: Secondary | ICD-10-CM | POA: Diagnosis not present

## 2021-12-04 DIAGNOSIS — M545 Low back pain, unspecified: Secondary | ICD-10-CM | POA: Diagnosis not present

## 2021-12-04 DIAGNOSIS — M79605 Pain in left leg: Secondary | ICD-10-CM | POA: Diagnosis not present

## 2021-12-16 DIAGNOSIS — M79605 Pain in left leg: Secondary | ICD-10-CM | POA: Diagnosis not present

## 2021-12-16 DIAGNOSIS — M545 Low back pain, unspecified: Secondary | ICD-10-CM | POA: Diagnosis not present

## 2021-12-26 DIAGNOSIS — M545 Low back pain, unspecified: Secondary | ICD-10-CM | POA: Diagnosis not present

## 2021-12-26 DIAGNOSIS — M79605 Pain in left leg: Secondary | ICD-10-CM | POA: Diagnosis not present

## 2022-01-12 DIAGNOSIS — M5451 Vertebrogenic low back pain: Secondary | ICD-10-CM | POA: Diagnosis not present

## 2022-01-27 ENCOUNTER — Other Ambulatory Visit: Payer: Self-pay | Admitting: Family Medicine

## 2022-01-27 DIAGNOSIS — Z0000 Encounter for general adult medical examination without abnormal findings: Secondary | ICD-10-CM | POA: Diagnosis not present

## 2022-01-27 DIAGNOSIS — M85851 Other specified disorders of bone density and structure, right thigh: Secondary | ICD-10-CM | POA: Diagnosis not present

## 2022-01-27 DIAGNOSIS — Z1331 Encounter for screening for depression: Secondary | ICD-10-CM | POA: Diagnosis not present

## 2022-01-27 DIAGNOSIS — R7303 Prediabetes: Secondary | ICD-10-CM | POA: Diagnosis not present

## 2022-01-27 DIAGNOSIS — Z1211 Encounter for screening for malignant neoplasm of colon: Secondary | ICD-10-CM | POA: Diagnosis not present

## 2022-01-27 DIAGNOSIS — E559 Vitamin D deficiency, unspecified: Secondary | ICD-10-CM | POA: Diagnosis not present

## 2022-01-27 DIAGNOSIS — E7800 Pure hypercholesterolemia, unspecified: Secondary | ICD-10-CM | POA: Diagnosis not present

## 2022-01-29 DIAGNOSIS — Z1211 Encounter for screening for malignant neoplasm of colon: Secondary | ICD-10-CM | POA: Diagnosis not present

## 2022-02-10 ENCOUNTER — Other Ambulatory Visit: Payer: Self-pay | Admitting: Family Medicine

## 2022-02-10 DIAGNOSIS — M8580 Other specified disorders of bone density and structure, unspecified site: Secondary | ICD-10-CM

## 2022-03-24 ENCOUNTER — Ambulatory Visit: Payer: PPO | Admitting: Physician Assistant

## 2022-04-07 DIAGNOSIS — H43812 Vitreous degeneration, left eye: Secondary | ICD-10-CM | POA: Diagnosis not present

## 2022-04-13 DIAGNOSIS — Z1231 Encounter for screening mammogram for malignant neoplasm of breast: Secondary | ICD-10-CM | POA: Diagnosis not present

## 2022-06-10 DIAGNOSIS — R195 Other fecal abnormalities: Secondary | ICD-10-CM | POA: Diagnosis not present

## 2022-06-22 DIAGNOSIS — R059 Cough, unspecified: Secondary | ICD-10-CM | POA: Diagnosis not present

## 2022-06-22 DIAGNOSIS — U071 COVID-19: Secondary | ICD-10-CM | POA: Diagnosis not present

## 2022-07-09 DIAGNOSIS — Z01419 Encounter for gynecological examination (general) (routine) without abnormal findings: Secondary | ICD-10-CM | POA: Diagnosis not present

## 2022-07-09 DIAGNOSIS — Z124 Encounter for screening for malignant neoplasm of cervix: Secondary | ICD-10-CM | POA: Diagnosis not present

## 2022-07-23 ENCOUNTER — Ambulatory Visit
Admission: RE | Admit: 2022-07-23 | Discharge: 2022-07-23 | Disposition: A | Discharge status: Home / Self Care | Payer: PPO | Source: Ambulatory Visit | Attending: Family Medicine | Admitting: Family Medicine

## 2022-07-23 DIAGNOSIS — M8580 Other specified disorders of bone density and structure, unspecified site: Secondary | ICD-10-CM

## 2022-07-23 DIAGNOSIS — Z780 Asymptomatic menopausal state: Secondary | ICD-10-CM | POA: Diagnosis not present

## 2022-07-23 DIAGNOSIS — M85851 Other specified disorders of bone density and structure, right thigh: Secondary | ICD-10-CM | POA: Diagnosis not present

## 2023-02-08 ENCOUNTER — Encounter: Payer: Self-pay | Admitting: Dermatology

## 2023-02-08 ENCOUNTER — Ambulatory Visit (INDEPENDENT_AMBULATORY_CARE_PROVIDER_SITE_OTHER): Payer: PPO | Admitting: Dermatology

## 2023-02-08 VITALS — BP 141/73 | HR 75

## 2023-02-08 DIAGNOSIS — L578 Other skin changes due to chronic exposure to nonionizing radiation: Secondary | ICD-10-CM | POA: Diagnosis not present

## 2023-02-08 DIAGNOSIS — L821 Other seborrheic keratosis: Secondary | ICD-10-CM | POA: Diagnosis not present

## 2023-02-08 DIAGNOSIS — D229 Melanocytic nevi, unspecified: Secondary | ICD-10-CM

## 2023-02-08 DIAGNOSIS — Z808 Family history of malignant neoplasm of other organs or systems: Secondary | ICD-10-CM | POA: Diagnosis not present

## 2023-02-08 DIAGNOSIS — W908XXA Exposure to other nonionizing radiation, initial encounter: Secondary | ICD-10-CM | POA: Diagnosis not present

## 2023-02-08 DIAGNOSIS — L814 Other melanin hyperpigmentation: Secondary | ICD-10-CM | POA: Diagnosis not present

## 2023-02-08 DIAGNOSIS — D1801 Hemangioma of skin and subcutaneous tissue: Secondary | ICD-10-CM | POA: Diagnosis not present

## 2023-02-08 DIAGNOSIS — Z1283 Encounter for screening for malignant neoplasm of skin: Secondary | ICD-10-CM

## 2023-02-08 NOTE — Patient Instructions (Signed)
Skin Education : We counseled the patient regarding the following: Sun screen (SPF 30 or greater) should be applied during peak UV exposure (between 10am and 2pm) and reapplied after exercise or swimming.  The ABCDEs of melanoma were reviewed with the patient, and the importance of monthly self-examination of moles was emphasized. Should any moles change in shape or color, or itch, bleed or burn, pt will contact our office for evaluation sooner then their interval appointment.  Plan: Sunscreen Recommendations We recommended a broad spectrum sunscreen with a SPF of 30 or higher.  SPF 30 sunscreens block approximately 97 percent of the sun's harmful rays. Sunscreens should be applied at least 15 minutes prior to expected sun exposure and then every 2 hours after that as long as sun exposure continues. If swimming or exercising sunscreen should be reapplied every 45 minutes to an hour after getting wet or sweating. One ounce, or the equivalent of a shot glass full of sunscreen, is adequate to protect the skin not covered by a bathing suit. We also recommended a lip balm with a sunscreen as well. Sun protective clothing can be used in lieu of sunscreen but must be worn the entire time you are exposed to the sun's rays.   Important Information   Due to recent changes in healthcare laws, you may see results of your pathology and/or laboratory studies on MyChart before the doctors have had a chance to review them. We understand that in some cases there may be results that are confusing or concerning to you. Please understand that not all results are received at the same time and often the doctors may need to interpret multiple results in order to provide you with the best plan of care or course of treatment. Therefore, we ask that you please give [place] 2 business days to thoroughly review all your results before contacting the office for clarification. Should we see a critical lab result, you will be contacted  sooner.     If You Need Anything After Your Visit   If you have any questions or concerns for your doctor, please call our main line at [phone]. If no one answers, please leave a voicemail as directed and we will return your call as soon as possible. Messages left after 4 pm will be answered the following business day.    You may also send [place] a message via MyChart. We typically respond to MyChart messages within 1-2 business days.  For prescription refills, please ask your pharmacy to contact our office. Our fax number is [phone].  If you have an urgent issue when the clinic is closed that cannot wait until the next business day, you can page your doctor at the number below.     Please note that while we do our best to be available for urgent issues outside of office hours, we are not available 24/7.    If you have an urgent issue and are unable to reach [place], you may choose to seek medical care at your doctor's office, retail clinic, urgent care center, or emergency room.   If you have a medical emergency, please immediately call 911 or go to the emergency department. In the event of inclement weather, please call our main line at [phone] for an update on the status of any delays or closures.  Dermatology Medication Tips: Please keep the boxes that topical medications come in in order to help keep track of the instructions about where and how to use these. Pharmacies  typically print the medication instructions only on the boxes and not directly on the medication tubes.   If your medication is too expensive, please contact our office at [phone] or send [place] a message through MyChart.    We are unable to tell what your co-pay for medications will be in advance as this is different depending on your insurance coverage. However, we may be able to find a substitute medication at lower cost or fill out paperwork to get insurance to cover a needed medication.    If a prior  authorization is required to get your medication covered by your insurance company, please allow [place] 1-2 business days to complete this process.   Drug prices often vary depending on where the prescription is filled and some pharmacies may offer cheaper prices.   The website www.goodrx.com contains coupons for medications through different pharmacies. The prices here do not account for what the cost may be with help from insurance (it may be cheaper with your insurance), but the website can give you the price if you did not use any insurance.  - You can print the associated coupon and take it with your prescription to the pharmacy.  - You may also stop by our office during regular business hours and pick up a GoodRx coupon card.  - If you need your prescription sent electronically to a different pharmacy, notify our office through [facility] or by phone at [phone]

## 2023-02-08 NOTE — Progress Notes (Signed)
   New Patient Visit   Subjective  Jaclyn [name] is a 72 y.o. female who presents for the following: Skin Cancer Screening and Full Body Skin Exam  The patient presents for Total-Body Skin Exam (TBSE) for skin cancer screening and mole check. The patient has spots, moles and lesions to be evaluated, some may be new or changing and the patient may have concern these could be cancer. Pts last skin check was around 2 years ago. Pt has no hx of skin cancer but has family hx of NMSC.  The following portions of the chart were reviewed this encounter and updated as appropriate: medications, allergies, medical history  Review of Systems:  No other skin or systemic complaints except as noted in HPI or Assessment and Plan.  Objective  Well appearing patient in no apparent distress; mood and affect are within normal limits.  A full examination was performed including scalp, head, eyes, ears, nose, lips, neck, chest, axillae, abdomen, back, buttocks, bilateral upper extremities, bilateral lower extremities, hands, feet, fingers, toes, fingernails, and toenails. All findings within normal limits unless otherwise noted below.   Relevant physical exam findings are noted in the Assessment and Plan.    Assessment & Plan   SKIN CANCER SCREENING PERFORMED TODAY.  ACTINIC DAMAGE - Chronic condition, secondary to cumulative UV/sun exposure - diffuse scaly erythematous macules with underlying dyspigmentation - Recommend daily broad spectrum sunscreen SPF 30+ to sun-exposed areas, reapply every 2 hours as needed.  - Staying in the shade or wearing long sleeves, sun glasses (UVA+UVB protection) and wide brim hats (4-inch brim around the entire circumference of the hat) are also recommended for sun protection.  - Call for new or changing lesions.  MELANOCYTIC NEVI - Tan-brown and/or pink-flesh-colored symmetric macules and papules - Benign appearing on exam today - Observation - Call clinic for new or  changing moles - Recommend daily use of broad spectrum spf 30+ sunscreen to sun-exposed areas.   SEBORRHEIC KERATOSIS - Stuck-on, waxy, tan-brown papules and/or plaques  - Benign-appearing - Discussed benign etiology and prognosis. - Observe - Call for any changes  LENTIGINES Exam: scattered tan macules Due to sun exposure Treatment Plan: Benign-appearing, observe. Recommend daily broad spectrum sunscreen SPF 30+ to sun-exposed areas, reapply every 2 hours as needed.  Call for any changes    HEMANGIOMA Exam: red papule(s) Discussed benign nature. Recommend observation. Call for changes.    Return in 2 years (on 02/07/2025) for TBSE.  I, Tillie Fantasia, CMA, am acting as scribe for Gwenith Daily, MD.   Documentation: I have reviewed the above documentation for accuracy and completeness, and I agree with the above.  Gwenith Daily, MD

## 2023-02-24 DIAGNOSIS — Z1331 Encounter for screening for depression: Secondary | ICD-10-CM | POA: Diagnosis not present

## 2023-02-24 DIAGNOSIS — R7303 Prediabetes: Secondary | ICD-10-CM | POA: Diagnosis not present

## 2023-02-24 DIAGNOSIS — Z1211 Encounter for screening for malignant neoplasm of colon: Secondary | ICD-10-CM | POA: Diagnosis not present

## 2023-02-24 DIAGNOSIS — M85851 Other specified disorders of bone density and structure, right thigh: Secondary | ICD-10-CM | POA: Diagnosis not present

## 2023-02-24 DIAGNOSIS — Z0000 Encounter for general adult medical examination without abnormal findings: Secondary | ICD-10-CM | POA: Diagnosis not present

## 2023-02-24 DIAGNOSIS — M25559 Pain in unspecified hip: Secondary | ICD-10-CM | POA: Diagnosis not present

## 2023-02-24 DIAGNOSIS — E7800 Pure hypercholesterolemia, unspecified: Secondary | ICD-10-CM | POA: Diagnosis not present

## 2023-02-24 DIAGNOSIS — R030 Elevated blood-pressure reading, without diagnosis of hypertension: Secondary | ICD-10-CM | POA: Diagnosis not present

## 2023-03-01 DIAGNOSIS — Z1211 Encounter for screening for malignant neoplasm of colon: Secondary | ICD-10-CM | POA: Diagnosis not present

## 2023-03-04 ENCOUNTER — Ambulatory Visit: Payer: PPO | Admitting: Dermatology

## 2023-04-12 DIAGNOSIS — H43812 Vitreous degeneration, left eye: Secondary | ICD-10-CM | POA: Diagnosis not present

## 2023-04-20 DIAGNOSIS — Z1231 Encounter for screening mammogram for malignant neoplasm of breast: Secondary | ICD-10-CM | POA: Diagnosis not present

## 2023-09-08 DIAGNOSIS — I872 Venous insufficiency (chronic) (peripheral): Secondary | ICD-10-CM | POA: Diagnosis not present

## 2023-09-08 DIAGNOSIS — R600 Localized edema: Secondary | ICD-10-CM | POA: Diagnosis not present

## 2023-09-08 DIAGNOSIS — M79661 Pain in right lower leg: Secondary | ICD-10-CM | POA: Diagnosis not present

## 2023-09-21 DIAGNOSIS — M7989 Other specified soft tissue disorders: Secondary | ICD-10-CM | POA: Diagnosis not present

## 2023-09-21 DIAGNOSIS — I83811 Varicose veins of right lower extremities with pain: Secondary | ICD-10-CM | POA: Diagnosis not present

## 2023-09-21 DIAGNOSIS — I83891 Varicose veins of right lower extremities with other complications: Secondary | ICD-10-CM | POA: Diagnosis not present

## 2023-09-29 DIAGNOSIS — R600 Localized edema: Secondary | ICD-10-CM | POA: Diagnosis not present

## 2023-09-29 DIAGNOSIS — I83892 Varicose veins of left lower extremities with other complications: Secondary | ICD-10-CM | POA: Diagnosis not present

## 2023-09-29 DIAGNOSIS — R252 Cramp and spasm: Secondary | ICD-10-CM | POA: Diagnosis not present

## 2023-10-05 DIAGNOSIS — M25552 Pain in left hip: Secondary | ICD-10-CM | POA: Diagnosis not present

## 2023-10-13 DIAGNOSIS — I83892 Varicose veins of left lower extremities with other complications: Secondary | ICD-10-CM | POA: Diagnosis not present

## 2023-10-16 DIAGNOSIS — E7800 Pure hypercholesterolemia, unspecified: Secondary | ICD-10-CM | POA: Diagnosis not present

## 2023-11-02 DIAGNOSIS — I83892 Varicose veins of left lower extremities with other complications: Secondary | ICD-10-CM | POA: Diagnosis not present

## 2023-11-09 ENCOUNTER — Encounter: Payer: Self-pay | Admitting: Physician Assistant

## 2023-11-09 ENCOUNTER — Ambulatory Visit: Admitting: Physician Assistant

## 2023-11-09 VITALS — BP 132/86

## 2023-11-09 DIAGNOSIS — L578 Other skin changes due to chronic exposure to nonionizing radiation: Secondary | ICD-10-CM

## 2023-11-09 DIAGNOSIS — L309 Dermatitis, unspecified: Secondary | ICD-10-CM | POA: Diagnosis not present

## 2023-11-09 DIAGNOSIS — W908XXA Exposure to other nonionizing radiation, initial encounter: Secondary | ICD-10-CM

## 2023-11-09 DIAGNOSIS — L719 Rosacea, unspecified: Secondary | ICD-10-CM | POA: Diagnosis not present

## 2023-11-09 MED ORDER — CLOBETASOL PROPIONATE 0.05 % EX OINT: 1.0000 | TOPICAL_OINTMENT | Freq: Two times a day (BID) | CUTANEOUS | 1 refills | Status: AC

## 2023-11-09 NOTE — Progress Notes (Signed)
   Follow-Up Visit   Subjective  Jaclyn [name] is a 73 y.o. female who presents for the following: Rash of arms that has been there for months. Itch is a 7 out of 10 on the bad days. She has tried OTC hydrocortisone cream but it did not seem to help.no new medications, soaps or lotions.    The following portions of the chart were reviewed this encounter and updated as appropriate: medications, allergies, medical history  Review of Systems:  No other skin or systemic complaints except as noted in HPI or Assessment and Plan.  Objective  Well appearing patient in no apparent distress; mood and affect are within normal limits.   A focused examination was performed of the following areas: Face, arms  Relevant exam findings are noted in the Assessment and Plan.    Assessment & Plan    Exam: Dermatitis unspecified   Treatment Plan: Clobetasol ointment Apply to affected areas of rash twice daily x 2 weeks. Take a 2 week break before restarting. Avoid face, groin, underarms.  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 It is treatable but not curable. It sometimes affects the eyes (ocular rosacea) as well. It may respond to topical and/or systemic medication and can flare with stress, sun exposure, alcohol, exercise, topical steroids (including hydrocortisone/cortisone 10) and some foods.  Daily application of broad spectrum spf 30+ sunscreen to face is recommended to reduce flares.  Treatment Plan: Discussed Metronidazole cream   ACTINIC DAMAGE - chronic, secondary to cumulative UV radiation exposure/sun exposure over time - diffuse scaly erythematous macules with underlying dyspigmentation - Recommend daily broad spectrum sunscreen SPF 30+ to sun-exposed areas, reapply every 2 hours as needed.  - Recommend  staying in the shade or wearing long sleeves, sun glasses (UVA+UVB protection) and wide brim hats (4-inch brim around the entire circumference of the hat). - Call for new or changing lesions. -Recommend Differin 0.1% gel Apply pea size amount to face at bedtime.  DERMATITIS, UNSPECIFIED   ROSACEA   ACTINIC SKIN DAMAGE    Return for as scheduled .  I, Roseline Hutchinson, CMA, am acting as scribe for Rashun Grattan K, PA-C .   Documentation: I have reviewed the above documentation for accuracy and completeness, and I agree with the above.  Karron Goens K, PA-C

## 2023-11-09 NOTE — Patient Instructions (Addendum)
 Topical steroids (such as triamcinolone, fluocinolone, fluocinonide, mometasone, clobetasol, halobetasol, betamethasone, hydrocortisone) can cause thinning and lightening of the skin if they are used for too long in the same area. Your physician has selected the right strength medicine for your problem and area affected on the body. Please use your medication only as directed by your physician to prevent side effects.    Important Information  Due to recent changes in healthcare laws, you may see results of your pathology and/or laboratory studies on MyChart before the doctors have had a chance to review them. We understand that in some cases there may be results that are confusing or concerning to you. Please understand that not all results are received at the same time and often the doctors may need to interpret multiple results in order to provide you with the best plan of care or course of treatment. Therefore, we ask that you please give [place] 2 business days to thoroughly review all your results before contacting the office for clarification. Should we see a critical lab result, you will be contacted sooner.   If You Need Anything After Your Visit  If you have any questions or concerns for your doctor, please call our main line at [phone] If no one answers, please leave a voicemail as directed and we will return your call as soon as possible. Messages left after 4 pm will be answered the following business day.   You may also send [place] a message via MyChart. We typically respond to MyChart messages within 1-2 business days.  For prescription refills, please ask your pharmacy to contact our office. Our fax number is [phone].  If you have an urgent issue when the clinic is closed that cannot wait until the next business day, you can page your doctor at the number below.    Please note that while we do our best to be available for urgent issues outside of office hours, we are not  available 24/7.   If you have an urgent issue and are unable to reach [place], you may choose to seek medical care at your doctor's office, retail clinic, urgent care center, or emergency room.  If you have a medical emergency, please immediately call 911 or go to the emergency department. In the event of inclement weather, please call our main line at [phone] for an update on the status of any delays or closures.  Dermatology Medication Tips: Please keep the boxes that topical medications come in in order to help keep track of the instructions about where and how to use these. Pharmacies typically print the medication instructions only on the boxes and not directly on the medication tubes.   If your medication is too expensive, please contact our office at [phone] or send [place] a message through MyChart.   We are unable to tell what your co-pay for medications will be in advance as this is different depending on your insurance coverage. However, we may be able to find a substitute medication at lower cost or fill out paperwork to get insurance to cover a needed medication.   If a prior authorization is required to get your medication covered by your insurance company, please allow [place] 1-2 business days to complete this process.  Drug prices often vary depending on where the prescription is filled and some pharmacies may offer cheaper prices.  The website www.goodrx.com contains coupons for medications through different pharmacies. The prices here do not account for what  the cost may be with help from insurance (it may be cheaper with your insurance), but the website can give you the price if you did not use any insurance.  - You can print the associated coupon and take it with your prescription to the pharmacy.  - You may also stop by our office during regular business hours and pick up a GoodRx coupon card.  - If you need your prescription sent electronically to a different pharmacy, notify  our office through [facility] or by phone at [phone]

## 2023-11-10 DIAGNOSIS — M25552 Pain in left hip: Secondary | ICD-10-CM | POA: Diagnosis not present

## 2023-11-15 DIAGNOSIS — E7800 Pure hypercholesterolemia, unspecified: Secondary | ICD-10-CM | POA: Diagnosis not present

## 2023-11-24 DIAGNOSIS — I83892 Varicose veins of left lower extremities with other complications: Secondary | ICD-10-CM | POA: Diagnosis not present

## 2023-11-24 DIAGNOSIS — I87392 Chronic venous hypertension (idiopathic) with other complications of left lower extremity: Secondary | ICD-10-CM | POA: Diagnosis not present

## 2023-12-15 DIAGNOSIS — M25552 Pain in left hip: Secondary | ICD-10-CM | POA: Diagnosis not present

## 2023-12-16 DIAGNOSIS — E7800 Pure hypercholesterolemia, unspecified: Secondary | ICD-10-CM | POA: Diagnosis not present

## 2023-12-27 DIAGNOSIS — M25552 Pain in left hip: Secondary | ICD-10-CM | POA: Diagnosis not present

## 2024-01-12 DIAGNOSIS — M25552 Pain in left hip: Secondary | ICD-10-CM | POA: Diagnosis not present

## 2024-01-16 DIAGNOSIS — E7800 Pure hypercholesterolemia, unspecified: Secondary | ICD-10-CM | POA: Diagnosis not present

## 2024-02-29 DIAGNOSIS — M7989 Other specified soft tissue disorders: Secondary | ICD-10-CM | POA: Diagnosis not present

## 2024-02-29 DIAGNOSIS — I83812 Varicose veins of left lower extremities with pain: Secondary | ICD-10-CM | POA: Diagnosis not present

## 2024-02-29 DIAGNOSIS — R252 Cramp and spasm: Secondary | ICD-10-CM | POA: Diagnosis not present

## 2024-02-29 DIAGNOSIS — I872 Venous insufficiency (chronic) (peripheral): Secondary | ICD-10-CM | POA: Diagnosis not present

## 2024-02-29 DIAGNOSIS — R600 Localized edema: Secondary | ICD-10-CM | POA: Diagnosis not present

## 2024-03-31 DIAGNOSIS — M85851 Other specified disorders of bone density and structure, right thigh: Secondary | ICD-10-CM | POA: Diagnosis not present

## 2024-03-31 DIAGNOSIS — M25559 Pain in unspecified hip: Secondary | ICD-10-CM | POA: Diagnosis not present

## 2024-03-31 DIAGNOSIS — R7303 Prediabetes: Secondary | ICD-10-CM | POA: Diagnosis not present

## 2024-03-31 DIAGNOSIS — Z0000 Encounter for general adult medical examination without abnormal findings: Secondary | ICD-10-CM | POA: Diagnosis not present

## 2024-03-31 DIAGNOSIS — E7800 Pure hypercholesterolemia, unspecified: Secondary | ICD-10-CM | POA: Diagnosis not present

## 2024-03-31 DIAGNOSIS — Z1331 Encounter for screening for depression: Secondary | ICD-10-CM | POA: Diagnosis not present

## 2024-04-20 DIAGNOSIS — Z1231 Encounter for screening mammogram for malignant neoplasm of breast: Secondary | ICD-10-CM | POA: Diagnosis not present
